# Patient Record
Sex: Female | Born: 1935 | Race: White | Hispanic: No | Smoking: Never smoker
Health system: Southern US, Community
[De-identification: ages and names within clinical notes are randomized; demographics above are authoritative.]

## PROBLEM LIST (undated history)

## (undated) DIAGNOSIS — H3530 Unspecified macular degeneration: Secondary | ICD-10-CM

## (undated) DIAGNOSIS — F0390 Unspecified dementia without behavioral disturbance: Secondary | ICD-10-CM

## (undated) DIAGNOSIS — H9190 Unspecified hearing loss, unspecified ear: Secondary | ICD-10-CM

## (undated) DIAGNOSIS — J45909 Unspecified asthma, uncomplicated: Secondary | ICD-10-CM

## (undated) DIAGNOSIS — C801 Malignant (primary) neoplasm, unspecified: Secondary | ICD-10-CM

## (undated) HISTORY — PX: MASTECTOMY: SHX3

---

## 2003-02-21 ENCOUNTER — Other Ambulatory Visit: Payer: Self-pay

## 2010-12-13 ENCOUNTER — Emergency Department: Payer: Self-pay | Admitting: Emergency Medicine

## 2010-12-14 ENCOUNTER — Emergency Department: Payer: Self-pay | Admitting: Emergency Medicine

## 2010-12-17 ENCOUNTER — Emergency Department: Payer: Self-pay | Admitting: Internal Medicine

## 2012-10-26 ENCOUNTER — Emergency Department: Payer: Self-pay | Admitting: Emergency Medicine

## 2012-10-26 LAB — CBC
HCT: 39.6 % — ABNORMAL LOW (ref 40.0–52.0)
HGB: 13.4 g/dL (ref 12.0–16.0)
MCH: 31 pg (ref 26.0–34.0)
MCHC: 33.8 g/dL (ref 32.0–36.0)
Platelet: 218 10*3/uL (ref 150–440)

## 2012-10-26 LAB — COMPREHENSIVE METABOLIC PANEL
Albumin: 4 g/dL (ref 3.4–5.0)
Alkaline Phosphatase: 194 U/L — ABNORMAL HIGH (ref 50–136)
Anion Gap: 7 (ref 7–16)
Calcium, Total: 9 mg/dL (ref 8.5–10.1)
Chloride: 102 mmol/L (ref 98–107)
Co2: 27 mmol/L (ref 21–32)
Creatinine: 0.85 mg/dL (ref 0.60–1.30)
EGFR (African American): >60
EGFR (Non-African Amer.): 55 — ABNORMAL LOW
Glucose: 137 mg/dL — ABNORMAL HIGH (ref 65–99)
Osmolality: 275 (ref 275–301)
Potassium: 3.9 mmol/L (ref 3.5–5.1)
SGOT(AST): 152 U/L — ABNORMAL HIGH (ref 15–37)
Sodium: 136 mmol/L (ref 136–145)

## 2012-10-26 LAB — URINALYSIS, COMPLETE
Bilirubin,UR: NEGATIVE
Ketone: NEGATIVE
Nitrite: NEGATIVE
Ph: 6 (ref 4.5–8.0)
RBC,UR: 3 /HPF (ref 0–5)
Specific Gravity: 1.013 (ref 1.003–1.030)

## 2014-08-30 ENCOUNTER — Encounter: Payer: Self-pay | Admitting: Emergency Medicine

## 2014-08-30 ENCOUNTER — Emergency Department: Payer: Commercial Managed Care - HMO

## 2014-08-30 ENCOUNTER — Other Ambulatory Visit: Payer: Self-pay

## 2014-08-30 ENCOUNTER — Observation Stay
Admission: EM | Admit: 2014-08-30 | Discharge: 2014-08-31 | Disposition: A | Discharge status: Home / Self Care | Payer: Commercial Managed Care - HMO | Attending: Internal Medicine | Admitting: Internal Medicine

## 2014-08-30 DIAGNOSIS — R748 Abnormal levels of other serum enzymes: Secondary | ICD-10-CM | POA: Insufficient documentation

## 2014-08-30 DIAGNOSIS — M79601 Pain in right arm: Secondary | ICD-10-CM | POA: Insufficient documentation

## 2014-08-30 DIAGNOSIS — M25519 Pain in unspecified shoulder: Secondary | ICD-10-CM | POA: Insufficient documentation

## 2014-08-30 DIAGNOSIS — Z853 Personal history of malignant neoplasm of breast: Secondary | ICD-10-CM | POA: Insufficient documentation

## 2014-08-30 DIAGNOSIS — R911 Solitary pulmonary nodule: Secondary | ICD-10-CM | POA: Diagnosis present

## 2014-08-30 DIAGNOSIS — I340 Nonrheumatic mitral (valve) insufficiency: Secondary | ICD-10-CM | POA: Diagnosis not present

## 2014-08-30 DIAGNOSIS — I517 Cardiomegaly: Secondary | ICD-10-CM | POA: Diagnosis not present

## 2014-08-30 DIAGNOSIS — R0789 Other chest pain: Principal | ICD-10-CM | POA: Insufficient documentation

## 2014-08-30 DIAGNOSIS — R413 Other amnesia: Secondary | ICD-10-CM | POA: Diagnosis not present

## 2014-08-30 DIAGNOSIS — I071 Rheumatic tricuspid insufficiency: Secondary | ICD-10-CM | POA: Diagnosis not present

## 2014-08-30 DIAGNOSIS — M4856XA Collapsed vertebra, not elsewhere classified, lumbar region, initial encounter for fracture: Secondary | ICD-10-CM | POA: Insufficient documentation

## 2014-08-30 DIAGNOSIS — R778 Other specified abnormalities of plasma proteins: Secondary | ICD-10-CM

## 2014-08-30 DIAGNOSIS — R0602 Shortness of breath: Secondary | ICD-10-CM | POA: Diagnosis not present

## 2014-08-30 DIAGNOSIS — I2510 Atherosclerotic heart disease of native coronary artery without angina pectoris: Secondary | ICD-10-CM | POA: Insufficient documentation

## 2014-08-30 DIAGNOSIS — R079 Chest pain, unspecified: Secondary | ICD-10-CM

## 2014-08-30 DIAGNOSIS — J9811 Atelectasis: Secondary | ICD-10-CM | POA: Insufficient documentation

## 2014-08-30 DIAGNOSIS — I10 Essential (primary) hypertension: Secondary | ICD-10-CM | POA: Diagnosis not present

## 2014-08-30 DIAGNOSIS — R7989 Other specified abnormal findings of blood chemistry: Secondary | ICD-10-CM

## 2014-08-30 HISTORY — DX: Malignant (primary) neoplasm, unspecified: C80.1

## 2014-08-30 LAB — CBC
HCT: 40.2 % (ref 35.0–47.0)
Hemoglobin: 13.4 g/dL (ref 12.0–16.0)
MCH: 29.8 pg (ref 26.0–34.0)
MCHC: 33.3 g/dL (ref 32.0–36.0)
MCV: 89.7 fL (ref 80.0–100.0)
PLATELETS: 178 10*3/uL (ref 150–440)
RBC: 4.48 MIL/uL (ref 3.80–5.20)
RDW: 14.8 % — AB (ref 11.5–14.5)
WBC: 12.5 10*3/uL — ABNORMAL HIGH (ref 3.6–11.0)

## 2014-08-30 LAB — URINALYSIS COMPLETE WITH MICROSCOPIC (ARMC ONLY)
BACTERIA UA: NONE SEEN
BILIRUBIN URINE: NEGATIVE
Glucose, UA: NEGATIVE mg/dL
LEUKOCYTES UA: NEGATIVE
NITRITE: NEGATIVE
PH: 5 (ref 5.0–8.0)
Protein, ur: NEGATIVE mg/dL
Specific Gravity, Urine: >1.06 — ABNORMAL HIGH (ref 1.005–1.030)

## 2014-08-30 LAB — TROPONIN I
Troponin I: 0.04 ng/mL — ABNORMAL HIGH (ref <0.031)
Troponin I: 0.05 ng/mL — ABNORMAL HIGH (ref <0.031)

## 2014-08-30 LAB — COMPREHENSIVE METABOLIC PANEL
ALBUMIN: 4.4 g/dL (ref 3.5–5.0)
ALT: 11 U/L — ABNORMAL LOW (ref 14–54)
ANION GAP: 10 (ref 5–15)
AST: 26 U/L (ref 15–41)
Alkaline Phosphatase: 63 U/L (ref 38–126)
BILIRUBIN TOTAL: 0.8 mg/dL (ref 0.3–1.2)
BUN: 26 mg/dL — ABNORMAL HIGH (ref 6–20)
CHLORIDE: 103 mmol/L (ref 101–111)
CO2: 26 mmol/L (ref 22–32)
Calcium: 9.4 mg/dL (ref 8.9–10.3)
Creatinine, Ser: 1.1 mg/dL — ABNORMAL HIGH (ref 0.44–1.00)
GFR calc Af Amer: 45 mL/min — ABNORMAL LOW (ref >60)
GFR calc non Af Amer: 39 mL/min — ABNORMAL LOW (ref >60)
GLUCOSE: 114 mg/dL — AB (ref 65–99)
POTASSIUM: 4.3 mmol/L (ref 3.5–5.1)
SODIUM: 139 mmol/L (ref 135–145)
TOTAL PROTEIN: 7.6 g/dL (ref 6.5–8.1)

## 2014-08-30 MED ORDER — ACETAMINOPHEN 325 MG PO TABS
650.0000 mg | ORAL_TABLET | Freq: Four times a day (QID) | ORAL | Status: DC | PRN
  Administered 2014-08-31: 650 mg via ORAL
  Filled 2014-08-30: qty 2

## 2014-08-30 MED ORDER — HEPARIN SODIUM (PORCINE) 5000 UNIT/ML IJ SOLN: 5000.0000 [IU] | Freq: Three times a day (TID) | INTRAMUSCULAR | Status: DC

## 2014-08-30 MED ORDER — ASPIRIN EC 81 MG PO TBEC
81.0000 mg | DELAYED_RELEASE_TABLET | Freq: Every day | ORAL | Status: DC
  Administered 2014-08-31: 81 mg via ORAL
  Filled 2014-08-30: qty 1

## 2014-08-30 MED ORDER — NITROGLYCERIN 0.4 MG SL SUBL: 0.4000 mg | SUBLINGUAL_TABLET | SUBLINGUAL | Status: DC | PRN

## 2014-08-30 MED ORDER — ONDANSETRON HCL 4 MG/2ML IJ SOLN: 4.0000 mg | Freq: Four times a day (QID) | INTRAMUSCULAR | Status: DC | PRN

## 2014-08-30 MED ORDER — MORPHINE SULFATE (PF) 2 MG/ML IV SOLN: 1.0000 mg | INTRAVENOUS | Status: DC | PRN

## 2014-08-30 MED ORDER — SODIUM CHLORIDE 0.9 % IV SOLN
INTRAVENOUS | Status: DC
  Administered 2014-08-30: 18:00:00 via INTRAVENOUS

## 2014-08-30 MED ORDER — ASPIRIN 81 MG PO CHEW
162.0000 mg | CHEWABLE_TABLET | Freq: Once | ORAL | Status: AC
  Administered 2014-08-30: 162 mg via ORAL
  Filled 2014-08-30: qty 2

## 2014-08-30 MED ORDER — DOCUSATE SODIUM 100 MG PO CAPS
100.0000 mg | ORAL_CAPSULE | Freq: Two times a day (BID) | ORAL | Status: DC
  Administered 2014-08-31: 100 mg via ORAL
  Filled 2014-08-30: qty 1

## 2014-08-30 MED ORDER — ACETAMINOPHEN 650 MG RE SUPP: 650.0000 mg | Freq: Four times a day (QID) | RECTAL | Status: DC | PRN

## 2014-08-30 MED ORDER — SODIUM CHLORIDE 0.9 % IJ SOLN: 3.0000 mL | Freq: Two times a day (BID) | INTRAMUSCULAR | Status: DC

## 2014-08-30 MED ORDER — IOHEXOL 350 MG/ML SOLN
75.0000 mL | Freq: Once | INTRAVENOUS | Status: AC | PRN
  Administered 2014-08-30: 75 mL via INTRAVENOUS

## 2014-08-30 MED ORDER — METOPROLOL SUCCINATE ER 25 MG PO TB24
25.0000 mg | ORAL_TABLET | Freq: Every day | ORAL | Status: DC
  Administered 2014-08-30 – 2014-08-31 (×2): 25 mg via ORAL
  Filled 2014-08-30 (×2): qty 1

## 2014-08-30 MED ORDER — ONDANSETRON HCL 4 MG PO TABS: 4.0000 mg | ORAL_TABLET | Freq: Four times a day (QID) | ORAL | Status: DC | PRN

## 2014-08-30 MED ORDER — BISACODYL 10 MG RE SUPP: 10.0000 mg | Freq: Every day | RECTAL | Status: DC | PRN

## 2014-08-30 NOTE — ED Provider Notes (Signed)
[facility] Emergency Department Provider Note  ____________________________________________  Time seen: Approximately 3:11 PM  I have reviewed the triage vital signs and the nursing notes.   HISTORY  Chief Complaint Chest Pain    HPI Brandi Nelson is a 81 y.o. female who reports no medical history. She presents today with right-sided chest pain that began 1-2 days ago, and has worsened throughout the day. She had to take 1 ibuprofen tablet today, which is highly unusual for her and still having sharp right-sided chest pain. Symptoms started in her right arm and now has moved into her right chest. She also feels slightly short of breath at times.  No previous history coronary disease. No recent surgeries. No leg swelling. Timing was rather sudden onset about 2 days ago, possibly associated with working in her yard though her memory is not perfect per her son.   Past Medical History  Diagnosis Date  . Cancer     There are no active problems to display for this patient.   History reviewed. No pertinent past surgical history.  No current outpatient prescriptions on file.  Allergies Review of patient's allergies indicates no known allergies.  No family history on file.  Social History Social History  Substance Use Topics  . Smoking status: Never Smoker   . Smokeless tobacco: None  . Alcohol Use: No   Does not smoke not drink Does not take any medicine No known drug allergies  Review of Systems Constitutional: No fever/chills Eyes: No visual changes. ENT: No sore throat. Cardiovascular: See history of present illness Respiratory: See history of present illness Gastrointestinal: No abdominal pain.  No nausea, no vomiting.  No diarrhea.  No constipation. Genitourinary: Negative for dysuria. Musculoskeletal: Negative for back pain. Skin: Negative for rash. Neurological: Negative for headaches, focal weakness or numbness.  10-point ROS  otherwise negative.  ____________________________________________   PHYSICAL EXAM:  VITAL SIGNS: ED Triage Vitals  Enc Vitals Group     BP 08/30/14 1335 142/57 mmHg     Pulse Rate 08/30/14 1335 97     Resp 08/30/14 1335 18     Temp 08/30/14 1335 99.2 F (37.3 C)     Temp Source 08/30/14 1335 Oral     SpO2 08/30/14 1335 96 %     Weight 08/30/14 1335 132 lb (59.875 kg)     Height 08/30/14 1335 4\' 11"  (1.499 m)     Head Cir --      Peak Flow --      Pain Score --      Pain Loc --      Pain Edu? --      Excl. in [place]? --     Constitutional: Alert and oriented to person and location. Well appearing and in no acute distress. Eyes: Conjunctivae are normal. PERRL. EOMI. Head: Atraumatic. Nose: No congestion/rhinnorhea. Mouth/Throat: Mucous membranes are moist.  Oropharynx non-erythematous. Neck: No stridor.   Cardiovascular: Irregular rate and rhythm. Pulse does correspond with ECG tracing. Grossly normal heart sounds.  Good peripheral circulation. Respiratory: Normal respiratory effort.  No retractions. Lungs CTAB. Gastrointestinal: Soft and nontender. No distention. No abdominal bruits. No CVA tenderness. Musculoskeletal: No lower extremity tenderness nor edema.  No joint effusions. Neurologic:  Normal speech and language. No gross focal neurologic deficits are appreciated. No gait instability. Skin:  Skin is warm, dry and intact. No rash noted. Psychiatric: Mood and affect are normal. Speech and behavior are normal.  Patient has a astonishingly normal exam  for a 81 year old female ____________________________________________   LABS (all labs ordered are listed, but only abnormal results are displayed)  Labs Reviewed  CBC - Abnormal; Notable for the following:    WBC 12.5 (*)    RDW 14.8 (*)    All other components within normal limits  COMPREHENSIVE METABOLIC PANEL - Abnormal; Notable for the following:    Glucose, Bld 114 (*)    BUN 26 (*)    Creatinine, Ser 1.10 (*)     ALT 11 (*)    GFR calc non Af Amer 39 (*)    GFR calc Af Amer 45 (*)    All other components within normal limits  TROPONIN I - Abnormal; Notable for the following:    Troponin I 0.04 (*)    All other components within normal limits   ____________________________________________  EKG  Reviewed and interpreted by me EKG time 3:10 PM Bifascicular block [place] No acute ischemic change noted in the setting of bifascicular block, with expected T wave inversions in anteroseptal leads Ventricular rate 90 PR 170 QRS 1:30 QTc 480 Reviewed and interpreted as bifascicular block, no previous for comparison, no evidence of ST elevation ____________________________________________  RADIOLOGY  CT ANGIO CHEST AORTA W/CM &/OR WO/CM (Final result) Result time: 08/30/14 16:34:14   Final result by Rad Results In Interface (08/30/14 16:34:14)   Narrative:   CLINICAL DATA: Chest and right arm pain for 2 days with shortness of breath. Initial encounter.  EXAM: CT ANGIOGRAPHY CHEST WITH CONTRAST  TECHNIQUE: Multidetector CT imaging of the chest was performed using the standard protocol during bolus administration of intravenous contrast. Multiplanar CT image reconstructions and MIPs were obtained to evaluate the vascular anatomy.  CONTRAST: 75 mL OMNIPAQUE IOHEXOL 350 MG/ML SOLN  COMPARISON: Single view of the chest earlier today.  FINDINGS: There is no aortic dissection or aneurysm. Calcific aortic and coronary atherosclerosis is noted. The patient has a normal three-vessel arch configuration. Although not designed to evaluate for pulmonary embolus. No central or proximal embolus is seen. There is cardiomegaly. No pleural or pericardial effusion. No axillary, hilar or mediastinal lymphadenopathy. A 0.2 cm right upper lobe nodule is noted on image 21. There is some dependent atelectasis.  Imaged upper abdomen is unremarkable. No lytic or sclerotic bony lesion is identified.  Remote T11, T12 and L1 compression fractures are seen.  Review of the MIP images confirms the above findings.  IMPRESSION: No acute abnormality. Negative for aortic dissection or aneurysm.  Calcific aortic and coronary atherosclerosis.  Cardiomegaly without edema.  0.2 cm right upper lobe pulmonary nodule. If the patient is at high risk for bronchogenic carcinoma, follow-up chest CT at 1 year is recommended. If the patient is at low risk, no follow-up is needed. This recommendation follows the consensus statement: Guidelines for Management of Small Pulmonary Nodules Detected on CT Scans: A Statement from the [place] as published in Radiology 2005; 237:395-400.  Remote T11, T12 and L1 compression fractures.   Electronically Signed By: Inge Rise M.D. On: 08/30/2014 16:34          DG Chest Port 1 View (Final result) Result time: 08/30/14 16:08:06   Final result by Rad Results In Interface (08/30/14 16:08:06)   Narrative:   CLINICAL DATA: pain in her chest and having some right arm pain, family states pt also has been c/o of some sob.  EXAM: PORTABLE CHEST - 1 VIEW  COMPARISON: None.  FINDINGS: Cardiac silhouette is mildly enlarged. The aorta is uncoiled. No mediastinal  or hilar masses or evidence of adenopathy.  Clear lungs. No pleural effusion or pneumothorax.  There old bilateral proximal humerus fractures. Bony thorax is diffusely demineralized.  IMPRESSION: No acute cardiopulmonary disease.     ____________________________________________   PROCEDURES  Procedure(s) performed: None  Critical Care performed: No  ____________________________________________   INITIAL IMPRESSION / ASSESSMENT AND PLAN / ED COURSE  Pertinent labs & imaging results that were available during my care of the patient were reviewed by me and considered in my medical decision making (see chart for details).  Patient presents with chest pain.  Right-sided radiating to the arm and chest, some movement into the chest over the last day. Patient does not have a known coronary disease, but is 89 this is hard to exclude.  Differential diagnosis certainly included acute coronary syndrome, pulmonary embolism, acute aortic dissection. Primary concern given the movement of her pain from the right arm and of the chest would be to rule out dissection. CT angiogram is negative for dissection, positive for pulmonary nodule.  First troponin is slightly +0.04. We will admit her to the hospital for ongoing management and further workup for chest pain.  FINAL CLINICAL IMPRESSION(S) / ED DIAGNOSES  Final diagnoses:  Chest pain, moderate coronary artery risk  Pulmonary nodule      Delman Kitten, MD 08/30/14 1640

## 2014-08-30 NOTE — ED Notes (Signed)
Pt to ED from [facility], family states pt got out and raked her leaves 2 days ago, states since yesterday pt has been c/o pain in her chest and having some right arm pain, family states pt also has been c/o of some sob

## 2014-08-30 NOTE — Progress Notes (Signed)
Patient admitted to unit. Oriented to room, call bell, and staff. Bed in lowest position. Fall safety plan reviewed. Full assessment to Epic. Skin assessment verified with Maddie Himes RN. Telemetry box verification with tele clerk- Box#:[id number]. Will continue to monitor.

## 2014-08-30 NOTE — ED Notes (Signed)
Gave verbal lab results (troponin 0.04) to [place], Therapist, sports.

## 2014-08-30 NOTE — Progress Notes (Signed)
Dr. Tressia Miners notified of patients high BP. Patient was just moved up here, has lots of family in the room and was just stuck by lab tech. Per MD, give her time to settle down and then recheck BP

## 2014-08-30 NOTE — H&P (Signed)
History and Physical    Brandi Nelson QTM:[id number] DOB: 30-May-1907 DOA: 08/30/2014  Referring physician: Dr. Jacqualine Code PCP: Madelyn Brunner, MD  Specialists: none  Chief Complaint: chest pain  HPI: Brandi Nelson is a 81 y.o. female has a past medical history significant for "cancer" now with sharp shooting substernal and right-sided chest pain radiating to right jaw and neck. Pain comes and goes. No cardiac hx. Currently pain-free. Troponin abnormal at 0.04. She is now admitted for further evaluation.  Review of Systems: The patient denies anorexia, fever, weight loss,, vision loss, decreased hearing, hoarseness, syncope, dyspnea on exertion, peripheral edema, balance deficits, hemoptysis, abdominal pain, melena, hematochezia, severe indigestion/heartburn, hematuria, incontinence, genital sores, muscle weakness, suspicious skin lesions, transient blindness, difficulty walking, depression, unusual weight change, abnormal bleeding, enlarged lymph nodes, angioedema, and breast masses.   Past Medical History  Diagnosis Date  . Cancer    History reviewed. No pertinent past surgical history. Social History:  reports that she has never smoked. She does not have any smokeless tobacco history on file. She reports that she does not drink alcohol. Her drug history is not on file.  No Known Allergies  History reviewed. No pertinent family history.  Prior to Admission medications   Not on File   Physical Exam: Filed Vitals:   08/30/14 1335  BP: 142/57  Pulse: 97  Temp: 99.2 F (37.3 C)  TempSrc: Oral  Resp: 18  Height: 4\' 11"  (1.499 m)  Weight: 59.875 kg (132 lb)  SpO2: 96%     General:  No apparent distress  Eyes: PERRL, EOMI, no scleral icterus  ENT: moist oropharynx  Neck: supple, no lymphadenopathy  Cardiovascular: regular rate without MRG; 2+ peripheral pulses, no JVD, no peripheral edema  Respiratory: CTA biL, good air movement without wheezing, rhonchi or  crackled  Abdomen: soft, non tender to palpation, positive bowel sounds, no guarding, no rebound  Skin: no rashes  Musculoskeletal: normal bulk and tone, no joint swelling  Psychiatric: normal mood and affect  Neurologic: CN 2-12 grossly intact, MS 5/5 in all 4  Labs on Admission:  Basic Metabolic Panel:  Recent Labs Lab 08/30/14 1340  NA 139  K 4.3  CL 103  CO2 26  GLUCOSE 114*  BUN 26*  CREATININE 1.10*  CALCIUM 9.4   Liver Function Tests:  Recent Labs Lab 08/30/14 1340  AST 26  ALT 11*  ALKPHOS 63  BILITOT 0.8  PROT 7.6  ALBUMIN 4.4   No results for input(s): LIPASE, AMYLASE in the last 168 hours. No results for input(s): AMMONIA in the last 168 hours. CBC:  Recent Labs Lab 08/30/14 1340  WBC 12.5*  HGB 13.4  HCT 40.2  MCV 89.7  PLT 178   Cardiac Enzymes:  Recent Labs Lab 08/30/14 1340  TROPONINI 0.04*    BNP (last 3 results) No results for input(s): BNP in the last 8760 hours.  ProBNP (last 3 results) No results for input(s): PROBNP in the last 8760 hours.  CBG: No results for input(s): GLUCAP in the last 168 hours.  Radiological Exams on Admission: Dg Chest Port 1 View  08/30/2014   CLINICAL DATA:  pain in her chest and having some right arm pain, family states pt also has been c/o of some sob.  EXAM: PORTABLE CHEST - 1 VIEW  COMPARISON:  None.  FINDINGS: Cardiac silhouette is mildly enlarged. The aorta is uncoiled. No mediastinal or hilar masses or evidence of adenopathy.  Clear lungs.  No pleural effusion or pneumothorax.  There old bilateral proximal humerus fractures. Bony thorax is diffusely demineralized.  IMPRESSION: No acute cardiopulmonary disease.   Electronically Signed   By: Lajean Manes M.D.   On: 08/30/2014 16:08   Ct Angio Chest Aorta W/cm &/or Wo/cm  08/30/2014   CLINICAL DATA:  Chest and right arm pain for 2 days with shortness of breath. Initial encounter.  EXAM: CT ANGIOGRAPHY CHEST WITH CONTRAST  TECHNIQUE:  Multidetector CT imaging of the chest was performed using the standard protocol during bolus administration of intravenous contrast. Multiplanar CT image reconstructions and MIPs were obtained to evaluate the vascular anatomy.  CONTRAST:  75 mL OMNIPAQUE IOHEXOL 350 MG/ML SOLN  COMPARISON:  Single view of the chest earlier today.  FINDINGS: There is no aortic dissection or aneurysm. Calcific aortic and coronary atherosclerosis is noted. The patient has a normal three-vessel arch configuration. Although not designed to evaluate for pulmonary embolus. No central or proximal embolus is seen. There is cardiomegaly. No pleural or pericardial effusion. No axillary, hilar or mediastinal lymphadenopathy. A 0.2 cm right upper lobe nodule is noted on image 21. There is some dependent atelectasis.  Imaged upper abdomen is unremarkable. No lytic or sclerotic bony lesion is identified. Remote T11, T12 and L1 compression fractures are seen.  Review of the MIP images confirms the above findings.  IMPRESSION: No acute abnormality.  Negative for aortic dissection or aneurysm.  Calcific aortic and coronary atherosclerosis.  Cardiomegaly without edema.  0.2 cm right upper lobe pulmonary nodule. If the patient is at high risk for bronchogenic carcinoma, follow-up chest CT at 1 year is recommended. If the patient is at low risk, no follow-up is needed. This recommendation follows the consensus statement: Guidelines for Management of Small Pulmonary Nodules Detected on CT Scans: A Statement from the [place] as published in Radiology 2005; 237:395-400.  Remote T11, T12 and L1 compression fractures.   Electronically Signed   By: Inge Rise M.D.   On: 08/30/2014 16:34    EKG: Independently reviewed.  Assessment/Plan Principal Problem:   Chest pain Active Problems:   Elevated troponin   Will observe on telemetry and follow enzymes. Order echo and Cardiology consult. Monitor BP closely. CM consult for D/C  planning.  Diet: heart healthy Fluids: NS@75  DVT Prophylaxis: SQ Heparin  Code Status: DNR  Family Communication: yes  Disposition Plan: home  Time spent: 50 min

## 2014-08-31 ENCOUNTER — Observation Stay
Admit: 2014-08-31 | Discharge: 2014-08-31 | Disposition: A | Discharge status: Home / Self Care | Payer: Commercial Managed Care - HMO | Attending: Internal Medicine | Admitting: Internal Medicine

## 2014-08-31 LAB — TROPONIN I: Troponin I: 0.04 ng/mL — ABNORMAL HIGH (ref <0.031)

## 2014-08-31 MED ORDER — METOPROLOL SUCCINATE ER 25 MG PO TB24: 12.5000 mg | ORAL_TABLET | Freq: Every day | ORAL | Status: DC

## 2014-08-31 MED ORDER — NITROGLYCERIN 0.4 MG SL SUBL: 0.4000 mg | SUBLINGUAL_TABLET | SUBLINGUAL | Status: DC | PRN

## 2014-08-31 MED ORDER — ASPIRIN 81 MG PO TBEC: 81.0000 mg | DELAYED_RELEASE_TABLET | Freq: Every day | ORAL | Status: DC

## 2014-08-31 NOTE — Discharge Summary (Signed)
[facility] [place] NAME: Brandi Nelson    MR#:  [id number]  DATE OF BIRTH:  May 18, 1907  DATE OF ADMISSION:  08/30/2014 ADMITTING PHYSICIAN: Idelle Crouch, MD  DATE OF DISCHARGE: 08/31/14  PRIMARY CARE PHYSICIAN: Madelyn Brunner, MD    ADMISSION DIAGNOSIS:  Pulmonary nodule [R91.1] Chest pain, moderate coronary artery risk [R07.9]  DISCHARGE DIAGNOSIS:  Chest pain with shoulder pain supect muscular strain from recent yard work Mild HTN Memory loss  SECONDARY DIAGNOSIS:   Past Medical History  Diagnosis Date  . Cancer     breast    HOSPITAL COURSE:   81 year old Brandi Nelson with past medical history of breast cancer status post mastectomy comes into the emergency room after she started having some chest discomfort shoulder pain and back pain after raking leaves in her yard. Her family she is quite she is active and likes to do her work around at home. She was admitted with  1. Chest pain with minimal elevated troponin EKG shows bifascicular block no old EKG for comparison. On aspirin and metoprolol Seen by Dr.fath recommended to continue medical management Echo pending Hematologically stable PT to evaluate prior to discharge  2. Mild elevated blood pressure Low-dose metoprolol  3. memory imprairment -recommend to get PCP evaluate for dementia as out pt.  Spoke with patient's daughter-in-law and son over the phone. Patient will be discharged later if she continues to remain stable.  DISCHARGE CONDITIONS:   fair CONSULTS OBTAINED:  Treatment Team:  Teodoro Spray, MD  DRUG ALLERGIES:  No Known Allergies  DISCHARGE MEDICATIONS:   Current Discharge Medication List    START taking these medications   Details  aspirin EC 81 MG EC tablet Take 1 tablet (81 mg total) by mouth daily. Qty: 30 tablet, Refills: 0    metoprolol succinate (TOPROL-XL) 25 MG 24 hr tablet Take 0.5 tablets (12.5 mg total) by  mouth daily. Qty: 30 tablet, Refills: 0    nitroGLYCERIN (NITROSTAT) 0.4 MG SL tablet Place 1 tablet (0.4 mg total) under the tongue every 5 (five) minutes as needed for chest pain. Qty: 20 tablet, Refills: 12        If you experience worsening of your admission symptoms, develop shortness of breath, life threatening emergency, suicidal or homicidal thoughts you must seek medical attention immediately by calling 911 or calling your MD immediately  if symptoms less severe.  You Must read complete instructions/literature along with all the possible adverse reactions/side effects for all the Medicines you take and that have been prescribed to you. Take any new Medicines after you have completely understood and accept all the possible adverse reactions/side effects.   Please note  You were cared for by a hospitalist during your hospital stay. If you have any questions about your discharge medications or the care you received while you were in the hospital after you are discharged, you can call the unit and asked to speak with the hospitalist on call if the hospitalist that took care of you is not available. Once you are discharged, your primary care physician will handle any further medical issues. Please note that NO REFILLS for any discharge medications will be authorized once you are discharged, as it is imperative that you return to your primary care physician (or establish a relationship with a primary care physician if you do not have one) for your aftercare needs so that they can reassess your need for  medications and monitor your lab values. Today   SUBJECTIVE  Doing well. No cp. Eating lunch. Hard on hearing. Pleasant confusion   VITAL SIGNS:  Blood pressure 167/69, pulse 85, temperature 97.7 F (36.5 C), temperature source Oral, resp. rate 18, height 4\' 11"  (1.499 m), weight 62.46 kg (137 lb 11.2 oz), SpO2 97 %.  I/O:   Intake/Output Summary (Last 24 hours) at 08/31/14 1326 Last  data filed at 08/31/14 1024  Gross per 24 hour  Intake   1065 ml  Output    445 ml  Net    620 ml    PHYSICAL EXAMINATION:  GENERAL:  81 y.o.-year-old patient lying in the bed with no acute distress.  EYES: Pupils equal, round, reactive to light and accommodation. No scleral icterus. Extraocular muscles intact.  HEENT: Head atraumatic, normocephalic. Oropharynx and nasopharynx clear.  NECK:  Supple, no jugular venous distention. No thyroid enlargement, no tenderness.  LUNGS: Normal breath sounds bilaterally, no wheezing, rales,rhonchi or crepitation. No use of accessory muscles of respiration. kyphosis CARDIOVASCULAR: S1, S2 normal. No murmurs, rubs, or gallops.  ABDOMEN: Soft, non-tender, non-distended. Bowel sounds present. No organomegaly or mass.  EXTREMITIES: No pedal edema, cyanosis, or clubbing.  NEUROLOGIC: Cranial nerves II through XII are intact. Muscle strength 5/5 in all extremities. Sensation intact. Gait not checked.  PSYCHIATRIC: The patient is alert and pleasantly confused  SKIN: No obvious rash, lesion, or ulcer.   DATA REVIEW:   CBC   Recent Labs Lab 08/30/14 1340  WBC 12.5*  HGB 13.4  HCT 40.2  PLT 178    Chemistries   Recent Labs Lab 08/30/14 1340  NA 139  K 4.3  CL 103  CO2 26  GLUCOSE 114*  BUN 26*  CREATININE 1.10*  CALCIUM 9.4  AST 26  ALT 11*  ALKPHOS 34  BILITOT 0.8    Microbiology Results   No results found for this or any previous visit (from the past 240 hour(s)).  RADIOLOGY:  Dg Chest Port 1 View  08/30/2014   CLINICAL DATA:  pain in her chest and having some right arm pain, family states pt also has been c/o of some sob.  EXAM: PORTABLE CHEST - 1 VIEW  COMPARISON:  None.  FINDINGS: Cardiac silhouette is mildly enlarged. The aorta is uncoiled. No mediastinal or hilar masses or evidence of adenopathy.  Clear lungs.  No pleural effusion or pneumothorax.  There old bilateral proximal humerus fractures. Bony thorax is diffusely  demineralized.  IMPRESSION: No acute cardiopulmonary disease.   Electronically Signed   By: Lajean Manes M.D.   On: 08/30/2014 16:08   Ct Angio Chest Aorta W/cm &/or Wo/cm  08/30/2014   CLINICAL DATA:  Chest and right arm pain for 2 days with shortness of breath. Initial encounter.  EXAM: CT ANGIOGRAPHY CHEST WITH CONTRAST  TECHNIQUE: Multidetector CT imaging of the chest was performed using the standard protocol during bolus administration of intravenous contrast. Multiplanar CT image reconstructions and MIPs were obtained to evaluate the vascular anatomy.  CONTRAST:  75 mL OMNIPAQUE IOHEXOL 350 MG/ML SOLN  COMPARISON:  Single view of the chest earlier today.  FINDINGS: There is no aortic dissection or aneurysm. Calcific aortic and coronary atherosclerosis is noted. The patient has a normal three-vessel arch configuration. Although not designed to evaluate for pulmonary embolus. No central or proximal embolus is seen. There is cardiomegaly. No pleural or pericardial effusion. No axillary, hilar or mediastinal lymphadenopathy. A 0.2 cm right upper lobe nodule is noted  on image 21. There is some dependent atelectasis.  Imaged upper abdomen is unremarkable. No lytic or sclerotic bony lesion is identified. Remote T11, T12 and L1 compression fractures are seen.  Review of the MIP images confirms the above findings.  IMPRESSION: No acute abnormality.  Negative for aortic dissection or aneurysm.  Calcific aortic and coronary atherosclerosis.  Cardiomegaly without edema.  0.2 cm right upper lobe pulmonary nodule. If the patient is at high risk for bronchogenic carcinoma, follow-up chest CT at 1 year is recommended. If the patient is at low risk, no follow-up is needed. This recommendation follows the consensus statement: Guidelines for Management of Small Pulmonary Nodules Detected on CT Scans: A Statement from the [place] as published in Radiology 2005; 237:395-400.  Remote T11, T12 and L1 compression  fractures.   Electronically Signed   By: Inge Rise M.D.   On: 08/30/2014 16:34     Management plans discussed with the patient, family and they are in agreement.  CODE STATUS:     Code Status Orders        Start     Ordered   08/30/14 1815  Do not attempt resuscitation (DNR)   Continuous    Question Answer Comment  In the event of cardiac or respiratory ARREST Do not call a "code blue"   In the event of cardiac or respiratory ARREST Do not perform Intubation, CPR, defibrillation or ACLS   In the event of cardiac or respiratory ARREST Use medication by any route, position, wound care, and other measures to relive pain and suffering. May use oxygen, suction and manual treatment of airway obstruction as needed for comfort.      08/30/14 1814      TOTAL TIME TAKING CARE OF THIS PATIENT: 40 minutes.    Konstantina Nachreiner M.D on 08/31/2014 at 1:26 PM  Between 7am to 6pm - Pager - [phone] After 6pm go to [email or web address] - password [email or web address] [facility]  Office  [phone]  CC: Primary care physician; Madelyn Brunner, MD

## 2014-08-31 NOTE — Care Management (Signed)
Provided patient's son with list of assisted living facilities and sitter list.  He acknowledges that he needs to plan for increased supervision for his mother

## 2014-08-31 NOTE — Care Management (Signed)
Patient presents from home.  Lives alone.  Her son [name] goes to patient's home and checks her daily and takes her out to lunch.  Patient is very hard of hearing even with her hearing aide.  Son has not taken patient to her PCP for the last year because she has [employer] and "my wife and I had that too and [facility] called me and told me if i wanted to be seen at [facility] I had to change my insurance so I did.  [facility] "is [facility]."  Spoke with [facility] and patient is in her caseload.  Contacted [facility] and informed that office accepts [facility].  Follow up appointment has been made with Lottie Mussel. Son feels the most urgent need is getting patient's hearing checked.  Discussed making appointment with [facility].  Son feels that  It seems "like out of the blue" that he has started having to repeat things to patient and or patient forgets previous conversations.  Recognizes patient has issues with hearing but he feels this is more of a memory problem.  Patient has refused to move in with her son.  Son verbalizes understanding of the need for patient to have closer supervision.  Says that family has discussed assisted living. Agreeable to home health nursing for assessment/observation, physical therapy for home safety evaluation, aide if need for assist with personal care and social work for long range planning.  No agency preference.  [facility].  Attending will enter the home health order and face to face within the hour.  Referral to [facility]. Informed to call patient's son roger to schedule home visit- [phone]. Home [place]  Provided patient's son with Med Alert brochure.

## 2014-08-31 NOTE — Care Management (Signed)
Patient previously reported that se had a walker at hoe.  Son states this is not the case.  Obtained order from attending.  Agency preference is "whoever can get it here the quickest".  Referral called to [facility]

## 2014-08-31 NOTE — Discharge Instructions (Signed)
HHPT/RN  HOME HEALTH NURSE, PT, AIDE (IF NEEDED) SOCIAL WORK.  [facility]    1 [phone]

## 2014-08-31 NOTE — Progress Notes (Addendum)
Discharge instructions given to patient and her son. Education on metoprolol, nitroglycerin, and chest pain given. No IV to remove since patient inadvertently removed it last night. Tele removed and given to clerk by RN. Patient will follow up with PCP outpatient. Home health has been set up by care management and phone number given. No further questions. Patient is in no distress. Prescriptions electronically submitted.

## 2014-08-31 NOTE — Evaluation (Signed)
Physical Therapy Evaluation Patient Details Name: Brandi Nelson MRN: [id number] DOB: 1907-01-23 Today's Date: 08/31/2014   History of Present Illness  presented to ER and admitted under observation secondary to substernal chest pain radiating to R jaw, mild elevation in troponin (peak at 0.05; okay for participation with session per Dr. Fritzi Mandes).  Clinical Impression  Upon evaluation, patient alert and oriented to self only; follows all simple commands, but demonstrates marked deficit in STM requiring reorientation to situation multiple times throughout session.  Bilat UE/LE strength and ROM grossly WFL.  Currently requiring cga/min assist for all mobility without assist device; higher-level balance deficits noted as indicated by inability to maintain balance with dynamic gait components.  Mobility improved to cga/close sup with use of RW (associated with increase in gait speed as well).  Recommend continued use of RW with all mobility at this time. Patient/son voiced understanding. Would benefit from skilled PT to address above deficits and promote optimal return to PLOF.   Given cognitive impairment (exacerbated by hospital stay), recommend strict 24 hour sup/assist upon discharge.  Recommend transition to [place] upon discharge from acute hospitalization (question ability to tolerate, participate and progress with formal STR program).    Follow Up Recommendations Home health PT;Supervision/Assistance - 24 hour (strict 24 hour sup/assist; all home services available)    Equipment Recommendations  Rolling walker with 5" wheels    Recommendations for Other Services       Precautions / Restrictions Precautions Precautions: Fall Restrictions Weight Bearing Restrictions: No      Mobility  Bed Mobility               General bed mobility comments: up in chair beginning/end of session  Transfers Overall transfer level: Needs assistance   Transfers: Sit to/from Stand Sit to  Stand: Min assist         General transfer comment: requires UE support to complete  Ambulation/Gait Ambulation/Gait assistance: Min assist Ambulation Distance (Feet): 220 Feet Assistive device: None     Gait velocity interpretation: <1.8 ft/sec, indicative of risk for recurrent falls (10' walk time, 8 seconds) General Gait Details: broad BOS with excessive lateral sway; veers laterally at itmes with dynamic gait components (step strategy with cga/min assist for recovery)  Stairs            Wheelchair Mobility    Modified Rankin (Stroke Patients Only)       Balance Overall balance assessment: Needs assistance Sitting-balance support: No upper extremity supported;Feet supported Sitting balance-Leahy Scale: Good     Standing balance support: No upper extremity supported Standing balance-Leahy Scale: Fair                               Pertinent Vitals/Pain Pain Assessment: No/denies pain    Home Living Family/patient expects to be discharged to:: Private residence Living Arrangements: Alone Available Help at Discharge: Family (sons supportive and able to provide assist upon discharge) Type of Home: House Home Access: Stairs to enter Entrance Stairs-Rails: Can reach both Entrance Stairs-Number of Steps: 3 Home Layout: Two level;Able to live on main level with bedroom/bathroom        Prior Function Level of Independence: Independent         Comments: Indep with household and limited community mobility     Hand Dominance        Extremity/Trunk Assessment   Upper Extremity Assessment: Overall WFL for tasks assessed  Lower Extremity Assessment: Overall WFL for tasks assessed         Communication   Communication: No difficulties  Cognition Arousal/Alertness: Awake/alert Behavior During Therapy: WFL for tasks assessed/performed;Impulsive Overall Cognitive Status: Impaired/Different from baseline Area of Impairment:  Orientation;Memory;Safety/judgement;Awareness;Problem solving Orientation Level: Disoriented to;Place;Time;Situation   Memory: Decreased short-term memory (reoriented to situation multiple times throughout session) Following Commands: Follows one step commands consistently     Problem Solving: Difficulty sequencing      General Comments      Exercises Other Exercises Other Exercises: Gait x220' with RW, cga/close sup--reciprocal stepping with improved gait stability, fluidity and overall safety (gait speed improved to 10' walk time in 6 seconds with RW), though tends to step away from when approaching seating surfaces.  Likely to forget need/use if assist device is out of sight.  Do recommend use with all mobility at this time; patient/son voiced understanding (10 min)      Assessment/Plan    PT Assessment Patient needs continued PT services  PT Diagnosis Difficulty walking;Generalized weakness   PT Problem List Decreased strength;Decreased range of motion;Decreased activity tolerance;Decreased balance;Decreased mobility;Decreased coordination;Decreased cognition;Decreased knowledge of use of DME;Decreased safety awareness;Decreased knowledge of precautions  PT Treatment Interventions DME instruction;Gait training;Stair training;Functional mobility training;Therapeutic activities;Therapeutic exercise;Balance training;Neuromuscular re-education;Cognitive remediation;Patient/family education   PT Goals (Current goals can be found in the Care Plan section) Acute Rehab PT Goals Patient Stated Goal: "to go home" PT Goal Formulation: With patient Time For Goal Achievement: 09/14/14 Potential to Achieve Goals: Fair    Frequency Min 2X/week   Barriers to discharge Decreased caregiver support      Co-evaluation               End of Session Equipment Utilized During Treatment: Gait belt Activity Tolerance: Patient tolerated treatment well Patient left: in chair;with  family/visitor present Nurse Communication: Mobility status    Functional Assessment Tool Used: clinical judgement; 10' walk time Functional Limitation: Mobility: Walking and moving around Mobility: Walking and Moving Around Current Status [phone]): At least 20 percent but less than 40 percent impaired, limited or restricted Mobility: Walking and Moving Around Goal Status [phone]): At least 1 percent but less than 20 percent impaired, limited or restricted    Time: 1411-1433 PT Time Calculation (min) (ACUTE ONLY): 22 min   Charges:   PT Evaluation $Initial PT Evaluation Tier I: 1 Procedure PT Treatments $Gait Training: 8-22 mins   PT G Codes:   PT G-Codes **NOT FOR INPATIENT CLASS** Functional Assessment Tool Used: clinical judgement; 10' walk time Functional Limitation: Mobility: Walking and moving around Mobility: Walking and Moving Around Current Status ([id number]): At least 20 percent but less than 40 percent impaired, limited or restricted Mobility: Walking and Moving Around Goal Status [phone]): At least 1 percent but less than 20 percent impaired, limited or restricted    Taleya Whitcher H. Lupa Shark, PT, DPT, NCS 08/31/2014, 5:08 PM [phone]

## 2014-08-31 NOTE — Progress Notes (Signed)
Brandi Nelson has arrived. Patient will now discharge via son. Taken out in wheelchair by staff.

## 2014-08-31 NOTE — Progress Notes (Signed)
*  PRELIMINARY RESULTS* Echocardiogram 2D Echocardiogram has been performed.  Brandi Nelson 08/31/2014, 2:44 PM

## 2014-08-31 NOTE — Consult Note (Signed)
[facility]  CARDIOLOGY CONSULT NOTE  Patient ID: SELETA HOVLAND MRN: [id number] DOB/AGE: 02/21/1907 81 y.o.  Admit date: 08/30/2014 Referring Physician Dr. Fritzi Mandes Primary Physician   Primary Cardiologist   Reason for Consultation Atypical chest pain.  HPI: Patient is a 81 year old female with out prior cardiac history who was admitted with complaints of midsternal chest pain.  Patient is fairly active for advanced age.  She was admitted as ruled out from myocardia infarction.  Echocardiogram revealed no significant wall motion abnormality.  She has improved since admission.  She denies syncope or presyncope.  ROS Review of Systems - History obtained from chart review and the patient General ROS: positive for  - Chest pain Respiratory ROS: negative Cardiovascular ROS: positive for - chest pain Gastrointestinal ROS: no abdominal pain, change in bowel habits, or black or bloody stools Musculoskeletal ROS: negative Neurological ROS: no TIA or stroke symptoms   Past Medical History  Diagnosis Date  . Cancer     breast    History reviewed. No pertinent family history.  Social History   Social History  . Marital Status: Widowed    Spouse Name: N/A  . Number of Children: N/A  . Years of Education: N/A   Occupational History  . Not on file.   Social History Main Topics  . Smoking status: Never Smoker   . Smokeless tobacco: Not on file  . Alcohol Use: No  . Drug Use: Not on file  . Sexual Activity: Not on file   Other Topics Concern  . Not on file   Social History Narrative  . No narrative on file    History reviewed. No pertinent past surgical history.   No prescriptions prior to admission    Physical Exam: Blood pressure 167/69, pulse 85, temperature 97.7 F (36.5 C), temperature source Oral, resp. rate 18, height 4\' 11"  (1.499 m), weight 62.46 kg (137 lb 11.2 oz), SpO2 97 %.    General appearance: alert and  cooperative Chest wall: no tenderness Cardio: regular rate and rhythm, S1, S2 normal, no murmur, click, rub or gallop GI: soft, non-tender; bowel sounds normal; no masses,  no organomegaly Pulses: 2+ and symmetric Neurologic: Grossly normal Labs:   Lab Results  Component Value Date   WBC 12.5* 08/30/2014   HGB 13.4 08/30/2014   HCT 40.2 08/30/2014   MCV 89.7 08/30/2014   PLT 178 08/30/2014    Recent Labs Lab 08/30/14 1340  NA 139  K 4.3  CL 103  CO2 26  BUN 26*  CREATININE 1.10*  CALCIUM 9.4  PROT 7.6  BILITOT 0.8  ALKPHOS 63  ALT 11*  AST 26  GLUCOSE 114*   Lab Results  Component Value Date   TROPONINI 0.04* 08/31/2014      Radiology:   No acute process EKG:  Sinus rhythm with incomplete right bundle-branch block no ischemia  ASSESSMENT AND PLAN:   Patient is on 81 year old female with no prior cardiac history admitted with atypical chest pain.  She is quite active working in her yard.  She had some chest wall tenderness.  She has improved since admission has ruled out from myocardia infarction.  Echocardiogram revealed preserved LV function no significant valvular abnormalities.  Will continue with medical therapy.  Would agree with discharge in outpatient follow-up as needed. Signed: Teodoro Spray MD, [facility] 08/31/2014, 4:28 PM

## 2014-08-31 NOTE — Progress Notes (Signed)
Notified physician of patient's condition. Patient is confused, she has removed telemetry and iv access. Pt is refusing care and medication. Family has been called in to help to help reorient.

## 2015-02-19 DIAGNOSIS — H903 Sensorineural hearing loss, bilateral: Secondary | ICD-10-CM | POA: Diagnosis not present

## 2015-02-19 DIAGNOSIS — R040 Epistaxis: Secondary | ICD-10-CM | POA: Diagnosis not present

## 2015-03-18 DIAGNOSIS — H6123 Impacted cerumen, bilateral: Secondary | ICD-10-CM | POA: Diagnosis not present

## 2015-03-18 DIAGNOSIS — H903 Sensorineural hearing loss, bilateral: Secondary | ICD-10-CM | POA: Diagnosis not present

## 2015-09-28 DIAGNOSIS — H6123 Impacted cerumen, bilateral: Secondary | ICD-10-CM | POA: Diagnosis not present

## 2015-09-28 DIAGNOSIS — H903 Sensorineural hearing loss, bilateral: Secondary | ICD-10-CM | POA: Diagnosis not present

## 2015-10-22 ENCOUNTER — Emergency Department: Payer: PPO

## 2015-10-22 ENCOUNTER — Emergency Department
Admission: EM | Admit: 2015-10-22 | Discharge: 2015-10-22 | Disposition: A | Discharge status: Home / Self Care | Payer: PPO | Attending: Emergency Medicine | Admitting: Emergency Medicine

## 2015-10-22 DIAGNOSIS — Y92018 Other place in single-family (private) house as the place of occurrence of the external cause: Secondary | ICD-10-CM | POA: Insufficient documentation

## 2015-10-22 DIAGNOSIS — Z7982 Long term (current) use of aspirin: Secondary | ICD-10-CM | POA: Diagnosis not present

## 2015-10-22 DIAGNOSIS — Z23 Encounter for immunization: Secondary | ICD-10-CM | POA: Insufficient documentation

## 2015-10-22 DIAGNOSIS — S0291XA Unspecified fracture of skull, initial encounter for closed fracture: Secondary | ICD-10-CM

## 2015-10-22 DIAGNOSIS — Z853 Personal history of malignant neoplasm of breast: Secondary | ICD-10-CM | POA: Diagnosis not present

## 2015-10-22 DIAGNOSIS — Y999 Unspecified external cause status: Secondary | ICD-10-CM | POA: Insufficient documentation

## 2015-10-22 DIAGNOSIS — S199XXA Unspecified injury of neck, initial encounter: Secondary | ICD-10-CM | POA: Diagnosis not present

## 2015-10-22 DIAGNOSIS — S0282XA Fracture of other specified skull and facial bones, left side, initial encounter for closed fracture: Secondary | ICD-10-CM | POA: Diagnosis not present

## 2015-10-22 DIAGNOSIS — W19XXXA Unspecified fall, initial encounter: Secondary | ICD-10-CM | POA: Diagnosis not present

## 2015-10-22 DIAGNOSIS — S0181XA Laceration without foreign body of other part of head, initial encounter: Secondary | ICD-10-CM | POA: Insufficient documentation

## 2015-10-22 DIAGNOSIS — S12000A Unspecified displaced fracture of first cervical vertebra, initial encounter for closed fracture: Secondary | ICD-10-CM | POA: Diagnosis not present

## 2015-10-22 DIAGNOSIS — W01198A Fall on same level from slipping, tripping and stumbling with subsequent striking against other object, initial encounter: Secondary | ICD-10-CM | POA: Diagnosis not present

## 2015-10-22 DIAGNOSIS — Y9389 Activity, other specified: Secondary | ICD-10-CM | POA: Insufficient documentation

## 2015-10-22 DIAGNOSIS — S02113A Unspecified occipital condyle fracture, initial encounter for closed fracture: Secondary | ICD-10-CM | POA: Diagnosis not present

## 2015-10-22 DIAGNOSIS — S0990XA Unspecified injury of head, initial encounter: Secondary | ICD-10-CM | POA: Diagnosis not present

## 2015-10-22 LAB — URINALYSIS COMPLETE WITH MICROSCOPIC (ARMC ONLY)
Bilirubin Urine: NEGATIVE
GLUCOSE, UA: NEGATIVE mg/dL
Ketones, ur: NEGATIVE mg/dL
LEUKOCYTES UA: NEGATIVE
Nitrite: NEGATIVE
Protein, ur: NEGATIVE mg/dL
Specific Gravity, Urine: 1.006 (ref 1.005–1.030)
pH: 7 (ref 5.0–8.0)

## 2015-10-22 MED ORDER — ACETAMINOPHEN 500 MG PO TABS
500.0000 mg | ORAL_TABLET | Freq: Once | ORAL | Status: AC
  Administered 2015-10-22: 500 mg via ORAL
  Filled 2015-10-22: qty 1

## 2015-10-22 MED ORDER — OXYMETAZOLINE HCL 0.05 % NA SOLN
1.0000 | Freq: Once | NASAL | Status: AC
  Administered 2015-10-22: 1 via NASAL

## 2015-10-22 MED ORDER — OXYMETAZOLINE HCL 0.05 % NA SOLN
NASAL | Status: AC
  Administered 2015-10-22: 1 via NASAL
  Filled 2015-10-22: qty 15

## 2015-10-22 MED ORDER — LIDOCAINE-EPINEPHRINE (PF) 1 %-1:200000 IJ SOLN
30.0000 mL | Freq: Once | INTRAMUSCULAR | Status: AC
  Administered 2015-10-22: 5 mL via INTRADERMAL

## 2015-10-22 MED ORDER — LIDOCAINE-EPINEPHRINE (PF) 1 %-1:200000 IJ SOLN
INTRAMUSCULAR | Status: AC
  Administered 2015-10-22: 5 mL via INTRADERMAL
  Filled 2015-10-22: qty 30

## 2015-10-22 MED ORDER — LIDOCAINE-EPINEPHRINE 1 %-1:100000 IJ SOLN: 30.0000 mL | Freq: Once | INTRAMUSCULAR | Status: DC

## 2015-10-22 MED ORDER — TETANUS-DIPHTH-ACELL PERTUSSIS 5-2.5-18.5 LF-MCG/0.5 IM SUSP
0.5000 mL | Freq: Once | INTRAMUSCULAR | Status: AC
  Administered 2015-10-22: 0.5 mL via INTRAMUSCULAR
  Filled 2015-10-22: qty 0.5

## 2015-10-22 NOTE — ED Notes (Signed)
Pt assisted to bathroom with this RN and Beverlee Nims, EDT.

## 2015-10-22 NOTE — ED Notes (Signed)
Pt son given crackers and diet coke.

## 2015-10-22 NOTE — ED Notes (Signed)
Pt assisted to toilet with this RN 

## 2015-10-22 NOTE — ED Notes (Signed)
Pt c/o of head pain from fall. EMS states 3 inch lac above R eye. Pt has bandage around head to control bleeding. Pt has nosebleed from fall, keeps blowing nose even though told to not. Pt denies taking a blood thinner.

## 2015-10-22 NOTE — ED Notes (Signed)
Pt taken to CT via stretcher. Son at bedside.  

## 2015-10-22 NOTE — ED Triage Notes (Signed)
Pt presents to ED via [facility] from home after a fall outside on some stepping stones. Per EMS pt has about a 3 inch lac above R eye. Pt has nose bleed, bleeding controlled. Bandage around head to control bleeding. Pt is alert and oriented. Son at bedside. Pt denies LOC, denies dizziness prior to fall.

## 2015-10-22 NOTE — ED Provider Notes (Signed)
[facility] Emergency Department Provider Note ____________________________________________   I have reviewed the triage vital signs and the triage nursing note.  HISTORY  Chief Complaint Fall and Head Laceration   Historian Patient and son  HPI Brandi Nelson is a 82 y.o. female who lives at home, tripped and fell striking her right forehead where she has a significant laceration. Her son had just brought her home from a shopping trip and he turned around after he heard a fall. She was able to stand up and is not reporting any pain in her extremities. She has a large laceration to her right forehead which is painful, moderate. She has some abrasion to the right side of her face and nose.     Past Medical History:  Diagnosis Date  . Cancer [facility])    breast    Patient Active Problem List   Diagnosis Date Noted  . Chest pain 08/30/2014  . Elevated troponin 08/30/2014    History reviewed. No pertinent surgical history.  Prior to Admission medications   Medication Sig Start Date End Date Taking? Authorizing Provider  aspirin EC 81 MG EC tablet Take 1 tablet (81 mg total) by mouth daily. 08/31/14   Fritzi Mandes, MD  metoprolol succinate (TOPROL-XL) 25 MG 24 hr tablet Take 0.5 tablets (12.5 mg total) by mouth daily. 08/31/14   Fritzi Mandes, MD  nitroGLYCERIN (NITROSTAT) 0.4 MG SL tablet Place 1 tablet (0.4 mg total) under the tongue every 5 (five) minutes as needed for chest pain. 08/31/14   Fritzi Mandes, MD    Allergies  Allergen Reactions  . Penicillin G     Other reaction(s): Unknown    History reviewed. No pertinent family history.  Social History Social History  Substance Use Topics  . Smoking status: Never Smoker  . Smokeless tobacco: Not on file  . Alcohol use No    Review of Systems  Constitutional: Negative for Recent illnesses. Eyes:No acute vision changes. ENT: Negative for sore throat.  Nosebleed that is now essentially  stopped. Cardiovascular: Negative for chest pain. Respiratory: Negative for shortness of breath. Gastrointestinal: Negative for abdominal pain. Genitourinary: Negative for dysuria. Musculoskeletal: Negative for back pain. Skin: Negative for rash. Neurological: Positive for headache, at the site at the right forehead where she has a laceration. 10 point Review of Systems otherwise negative ____________________________________________   PHYSICAL EXAM:  VITAL SIGNS: ED Triage Vitals [10/22/15 1417]  Enc Vitals Group     BP (!) 192/155     Pulse Rate 84     Resp 18     Temp 97.9 F (36.6 C)     Temp Source Oral     SpO2 97 %     Weight      Height      Head Circumference      Peak Flow      Pain Score      Pain Loc      Pain Edu?      Excl. in [place]?      Constitutional: Alert and Cooperative. Well appearing and in no distress. HEENT   Head: Large and deep right forehead laceration.      Eyes: Conjunctivae are normal. PERRL. Normal extraocular movements.      Ears:         Nose: No congestion/rhinnorhea.   Mouth/Throat: Mucous membranes are moist.   Neck: No stridor.  Mild tenderness to palpation in midline C-spine without step-off. Cardiovascular/Chest: Normal rate, regular rhythm.  No murmurs, rubs, or gallops. Respiratory: Normal respiratory effort without tachypnea nor retractions. Breath sounds are clear and equal bilaterally. No wheezes/rales/rhonchi. Gastrointestinal: Soft. No distention, no guarding, no rebound. Nontender.    Genitourinary/rectal:Deferred Musculoskeletal: Pelvis stable. Nontender with normal range of motion in all extremities. No joint effusions.  No lower extremity tenderness.  No edema. Neurologic:  Normal speech and language. No gross or focal neurologic deficits are appreciated. Skin:  Skin is warm, dry and intact. No rash noted. Psychiatric: Mood and affect are normal. Speech and behavior are normal. Patient exhibits appropriate  insight and judgment.   ____________________________________________  LABS (pertinent positives/negatives)  Labs Reviewed  URINALYSIS COMPLETEWITH MICROSCOPIC ([facility] ONLY) - Abnormal; Notable for the following:       Result Value   Color, Urine STRAW (*)    APPearance CLEAR (*)    Hgb urine dipstick 2+ (*)    Bacteria, UA RARE (*)    Squamous Epithelial / LPF 0-5 (*)    All other components within normal limits    ____________________________________________    EKG I, Lisa Roca, MD, the attending physician have personally viewed and interpreted all ECGs.  82 bpm. Normal sinus rhythm. Nonspecific interventricular conduction delay. Normal axis. Nonspecific ST and T-wave ____________________________________________  RADIOLOGY All Xrays were viewed by me. Imaging interpreted by Radiologist.  CT head and cervical spine noncontrast:  IMPRESSION: 1. Minimally displaced fracture within the medial aspect of the left occipital condyle. Underlying atlantoaxial joint space remains normally aligned. No additional fracture or subluxation within the cervical spine. Degenerative changes of the mid and lower cervical spine, as detailed above. 2. Soft tissue edema/lacerations overlying the right orbit and right lower frontal bone. No underlying fracture in this area. 3. No facial bone fracture seen. 4. No acute intracranial abnormality. No intracranial mass, hemorrhage or edema. No skull fracture seen. These results were called by telephone at the time of interpretation on 10/22/2015 at 4:05 pm to Dr. Lisa Roca , who verbally acknowledged these results.  ADDENDUM: After further review, there is an additional minimally displaced fracture within the left lateral aspects of the C1 vertebral body, at the base of the left transverse process (series 18, image 25).  The minimally displaced fracture within the adjacent left occipital condyles is also best seen on this series 18, at  images 23 through 25.  These additional results were called by telephone at the time of interpretation on 10/22/2015 at 5:25 pm to Dr. Lisa Roca , who verbally acknowledged these results. __________________________________________  PROCEDURES  Procedure(s) performed: LACERATION REPAIR Performed by: Lisa Roca Authorized by: Lisa Roca Consent: Verbal consent obtained. Risks and benefits: risks, benefits and alternatives were discussed Consent given by: patient Patient identity confirmed: provided demographic data Prepped and Draped in normal sterile fashion Wound explored  Laceration Location: Right forehead  Laceration Length: 20 cm  No Foreign Bodies seen or palpated  Anesthesia: local infiltration  Local anesthetic: lidocaine 1% with epinephrine  Anesthetic total: 6 ml  Irrigation method: syringe Amount of cleaning: standard  Skin closure: 6-0 prolene external 4-0 vicryl internally  Number of sutures: 11 external, 1 internal  Technique: interrupted  Patient tolerance: Patient tolerated the procedure well with no immediate complications.  Critical Care performed: None  ____________________________________________   ED COURSE / ASSESSMENT AND PLAN  Pertinent labs & imaging results that were available during my care of the patient were reviewed by me and considered in my medical decision making (see chart for details).   Ms. Biel  had a fall and has a large deep laceration to her right forehead. This is a presumed to be a mechanical fall as she was coming in after her son after a shopping outing. She is on aspirin.    She is 82 years old and has been living independently. Her sons are here with her.  CT of the head and cervical spine and maxillofacial show left occipital condyle fracture and body of C1 fracture which I discussed both of these fractures with the on-call neurosurgeon at [facility] who recommended discharge home, normal  activity.  Her facial lacerations were repaired, a nonadherent dressing was applied, and an Ace wrap was placed around the head as well. Afrin was utilized to help prevent recurrence of nosebleed.    CONSULTATIONS:  Dr. Cyndy Freeze, neurosurgery at [facility] - discussed occipital condyle fracture and body of C1 fracture near the transverse process, he recommends no intervention, no need for transfer, no need for cervical collar. He recommends discharge home to normal activity.  Patient / Family / Caregiver informed of clinical course, medical decision-making process, and agree with plan.   I discussed return precautions, follow-up instructions, and discharge instructions with patient and/or family.   ___________________________________________   FINAL CLINICAL IMPRESSION(S) / ED DIAGNOSES   Final diagnoses:  Facial laceration, initial encounter  Closed fracture of skull, unspecified bone, initial encounter ([place])  Closed fracture of first cervical vertebra, unspecified fracture morphology, initial encounter [facility])              Note: This dictation was prepared with Dragon dictation. Any transcriptional errors that result from this process are unintentional    Lisa Roca, MD 10/22/15 2115

## 2015-10-22 NOTE — ED Notes (Signed)
Pt urinated in toilet assisted by this RN. 3rd urination while in ED

## 2015-10-22 NOTE — ED Notes (Signed)
Pt's nose looks slightly crooked, not straight. Pt nose looks like a bruise may be forming, still bleeding slightly. Pt son informed that if it keeps bleeding, we can put something on her nose to help.

## 2015-10-22 NOTE — ED Notes (Signed)
Pt returned from CT via stretcher.

## 2015-10-22 NOTE — Discharge Instructions (Signed)
You need to have your stitches (11) checked for possible removal in about 7 days at your primary doctor's office.  Return to the emergency department for any uncontrolled bleeding, or any altered mental status, weakness or numbness or any other symptoms concerning to you.

## 2015-10-22 NOTE — ED Notes (Signed)
C-collar placed on pt. Pt has negative verbalization towards c-collar being on.

## 2015-10-26 DIAGNOSIS — S0181XD Laceration without foreign body of other part of head, subsequent encounter: Secondary | ICD-10-CM | POA: Diagnosis not present

## 2015-10-29 DIAGNOSIS — F0280 Dementia in other diseases classified elsewhere without behavioral disturbance: Secondary | ICD-10-CM | POA: Diagnosis not present

## 2015-10-29 DIAGNOSIS — L03211 Cellulitis of face: Secondary | ICD-10-CM | POA: Diagnosis not present

## 2015-10-29 DIAGNOSIS — S0181XD Laceration without foreign body of other part of head, subsequent encounter: Secondary | ICD-10-CM | POA: Diagnosis not present

## 2015-10-29 DIAGNOSIS — G301 Alzheimer's disease with late onset: Secondary | ICD-10-CM | POA: Diagnosis not present

## 2015-10-31 DIAGNOSIS — Z9181 History of falling: Secondary | ICD-10-CM | POA: Diagnosis not present

## 2015-10-31 DIAGNOSIS — I2510 Atherosclerotic heart disease of native coronary artery without angina pectoris: Secondary | ICD-10-CM | POA: Diagnosis not present

## 2015-10-31 DIAGNOSIS — S0181XD Laceration without foreign body of other part of head, subsequent encounter: Secondary | ICD-10-CM | POA: Diagnosis not present

## 2015-10-31 DIAGNOSIS — S12000D Unspecified displaced fracture of first cervical vertebra, subsequent encounter for fracture with routine healing: Secondary | ICD-10-CM | POA: Diagnosis not present

## 2015-10-31 DIAGNOSIS — S02109D Fracture of base of skull, unspecified side, subsequent encounter for fracture with routine healing: Secondary | ICD-10-CM | POA: Diagnosis not present

## 2015-10-31 DIAGNOSIS — Z853 Personal history of malignant neoplasm of breast: Secondary | ICD-10-CM | POA: Diagnosis not present

## 2015-10-31 DIAGNOSIS — J45909 Unspecified asthma, uncomplicated: Secondary | ICD-10-CM | POA: Diagnosis not present

## 2015-11-02 DIAGNOSIS — S0181XD Laceration without foreign body of other part of head, subsequent encounter: Secondary | ICD-10-CM | POA: Diagnosis not present

## 2015-11-04 DIAGNOSIS — Z853 Personal history of malignant neoplasm of breast: Secondary | ICD-10-CM | POA: Diagnosis not present

## 2015-11-04 DIAGNOSIS — S12000D Unspecified displaced fracture of first cervical vertebra, subsequent encounter for fracture with routine healing: Secondary | ICD-10-CM | POA: Diagnosis not present

## 2015-11-04 DIAGNOSIS — S0181XD Laceration without foreign body of other part of head, subsequent encounter: Secondary | ICD-10-CM | POA: Diagnosis not present

## 2015-11-04 DIAGNOSIS — I2510 Atherosclerotic heart disease of native coronary artery without angina pectoris: Secondary | ICD-10-CM | POA: Diagnosis not present

## 2015-11-04 DIAGNOSIS — Z9181 History of falling: Secondary | ICD-10-CM | POA: Diagnosis not present

## 2015-11-04 DIAGNOSIS — J45909 Unspecified asthma, uncomplicated: Secondary | ICD-10-CM | POA: Diagnosis not present

## 2015-11-04 DIAGNOSIS — S02109D Fracture of base of skull, unspecified side, subsequent encounter for fracture with routine healing: Secondary | ICD-10-CM | POA: Diagnosis not present

## 2015-11-11 DIAGNOSIS — S12000D Unspecified displaced fracture of first cervical vertebra, subsequent encounter for fracture with routine healing: Secondary | ICD-10-CM | POA: Diagnosis not present

## 2015-11-11 DIAGNOSIS — S0181XD Laceration without foreign body of other part of head, subsequent encounter: Secondary | ICD-10-CM | POA: Diagnosis not present

## 2015-11-11 DIAGNOSIS — I2510 Atherosclerotic heart disease of native coronary artery without angina pectoris: Secondary | ICD-10-CM | POA: Diagnosis not present

## 2015-11-11 DIAGNOSIS — Z853 Personal history of malignant neoplasm of breast: Secondary | ICD-10-CM | POA: Diagnosis not present

## 2015-11-11 DIAGNOSIS — S02109D Fracture of base of skull, unspecified side, subsequent encounter for fracture with routine healing: Secondary | ICD-10-CM | POA: Diagnosis not present

## 2015-11-11 DIAGNOSIS — J45909 Unspecified asthma, uncomplicated: Secondary | ICD-10-CM | POA: Diagnosis not present

## 2015-11-11 DIAGNOSIS — Z9181 History of falling: Secondary | ICD-10-CM | POA: Diagnosis not present

## 2015-11-29 ENCOUNTER — Emergency Department: Payer: PPO

## 2015-11-29 ENCOUNTER — Inpatient Hospital Stay
Admission: EM | Admit: 2015-11-29 | Discharge: 2015-12-01 | DRG: 202 | Disposition: A | Discharge status: Home Health | Payer: PPO | Attending: Internal Medicine | Admitting: Internal Medicine

## 2015-11-29 ENCOUNTER — Encounter: Payer: Self-pay | Admitting: Emergency Medicine

## 2015-11-29 DIAGNOSIS — J9601 Acute respiratory failure with hypoxia: Secondary | ICD-10-CM | POA: Diagnosis not present

## 2015-11-29 DIAGNOSIS — I509 Heart failure, unspecified: Secondary | ICD-10-CM | POA: Diagnosis not present

## 2015-11-29 DIAGNOSIS — H3530 Unspecified macular degeneration: Secondary | ICD-10-CM | POA: Diagnosis present

## 2015-11-29 DIAGNOSIS — Z7982 Long term (current) use of aspirin: Secondary | ICD-10-CM | POA: Diagnosis not present

## 2015-11-29 DIAGNOSIS — J45909 Unspecified asthma, uncomplicated: Secondary | ICD-10-CM | POA: Diagnosis present

## 2015-11-29 DIAGNOSIS — H9190 Unspecified hearing loss, unspecified ear: Secondary | ICD-10-CM | POA: Diagnosis not present

## 2015-11-29 DIAGNOSIS — J40 Bronchitis, not specified as acute or chronic: Secondary | ICD-10-CM

## 2015-11-29 DIAGNOSIS — J810 Acute pulmonary edema: Secondary | ICD-10-CM

## 2015-11-29 DIAGNOSIS — Z79899 Other long term (current) drug therapy: Secondary | ICD-10-CM | POA: Diagnosis not present

## 2015-11-29 DIAGNOSIS — R0602 Shortness of breath: Secondary | ICD-10-CM | POA: Diagnosis not present

## 2015-11-29 DIAGNOSIS — Z9010 Acquired absence of unspecified breast and nipple: Secondary | ICD-10-CM

## 2015-11-29 DIAGNOSIS — M6281 Muscle weakness (generalized): Secondary | ICD-10-CM

## 2015-11-29 DIAGNOSIS — J9690 Respiratory failure, unspecified, unspecified whether with hypoxia or hypercapnia: Secondary | ICD-10-CM | POA: Diagnosis present

## 2015-11-29 DIAGNOSIS — Z853 Personal history of malignant neoplasm of breast: Secondary | ICD-10-CM

## 2015-11-29 DIAGNOSIS — R262 Difficulty in walking, not elsewhere classified: Secondary | ICD-10-CM

## 2015-11-29 DIAGNOSIS — J209 Acute bronchitis, unspecified: Principal | ICD-10-CM | POA: Diagnosis present

## 2015-11-29 DIAGNOSIS — R062 Wheezing: Secondary | ICD-10-CM | POA: Diagnosis not present

## 2015-11-29 DIAGNOSIS — J441 Chronic obstructive pulmonary disease with (acute) exacerbation: Secondary | ICD-10-CM | POA: Diagnosis present

## 2015-11-29 HISTORY — DX: Unspecified hearing loss, unspecified ear: H91.90

## 2015-11-29 HISTORY — DX: Unspecified macular degeneration: H35.30

## 2015-11-29 LAB — COMPREHENSIVE METABOLIC PANEL
ALK PHOS: 77 U/L (ref 38–126)
ALT: 12 U/L — AB (ref 14–54)
ANION GAP: 9 (ref 5–15)
AST: 28 U/L (ref 15–41)
Albumin: 4 g/dL (ref 3.5–5.0)
BUN: 16 mg/dL (ref 6–20)
CO2: 25 mmol/L (ref 22–32)
Calcium: 8.8 mg/dL — ABNORMAL LOW (ref 8.9–10.3)
Chloride: 104 mmol/L (ref 101–111)
Creatinine, Ser: 0.93 mg/dL (ref 0.44–1.00)
GFR, EST AFRICAN AMERICAN: 55 mL/min — AB (ref >60)
GFR, EST NON AFRICAN AMERICAN: 47 mL/min — AB (ref >60)
GLUCOSE: 122 mg/dL — AB (ref 65–99)
POTASSIUM: 4.2 mmol/L (ref 3.5–5.1)
Sodium: 138 mmol/L (ref 135–145)
Total Bilirubin: 0.6 mg/dL (ref 0.3–1.2)
Total Protein: 7.3 g/dL (ref 6.5–8.1)

## 2015-11-29 LAB — CBC
HCT: 37.2 % (ref 35.0–47.0)
Hemoglobin: 12.5 g/dL (ref 12.0–16.0)
MCH: 29.9 pg (ref 26.0–34.0)
MCHC: 33.7 g/dL (ref 32.0–36.0)
MCV: 88.6 fL (ref 80.0–100.0)
PLATELETS: 219 10*3/uL (ref 150–440)
RBC: 4.2 MIL/uL (ref 3.80–5.20)
RDW: 14.8 % — ABNORMAL HIGH (ref 11.5–14.5)
WBC: 7.3 10*3/uL (ref 3.6–11.0)

## 2015-11-29 LAB — BRAIN NATRIURETIC PEPTIDE: B Natriuretic Peptide: 140 pg/mL — ABNORMAL HIGH (ref 0.0–100.0)

## 2015-11-29 LAB — TROPONIN I: Troponin I: <0.03 ng/mL (ref <0.03)

## 2015-11-29 MED ORDER — ALBUTEROL SULFATE (2.5 MG/3ML) 0.083% IN NEBU
5.0000 mg | INHALATION_SOLUTION | Freq: Once | RESPIRATORY_TRACT | Status: AC
  Administered 2015-11-29: 5 mg via RESPIRATORY_TRACT
  Filled 2015-11-29: qty 6

## 2015-11-29 MED ORDER — FUROSEMIDE 10 MG/ML IJ SOLN
40.0000 mg | Freq: Once | INTRAMUSCULAR | Status: AC
  Administered 2015-11-29: 40 mg via INTRAVENOUS
  Filled 2015-11-29: qty 4

## 2015-11-29 MED ORDER — ACETAMINOPHEN 325 MG PO TABS
650.0000 mg | ORAL_TABLET | Freq: Four times a day (QID) | ORAL | Status: DC | PRN
  Filled 2015-11-29: qty 2

## 2015-11-29 MED ORDER — ONDANSETRON HCL 4 MG/2ML IJ SOLN: 4.0000 mg | Freq: Four times a day (QID) | INTRAMUSCULAR | Status: DC | PRN

## 2015-11-29 MED ORDER — METHYLPREDNISOLONE SODIUM SUCC 125 MG IJ SOLR
60.0000 mg | INTRAMUSCULAR | Status: DC
  Administered 2015-11-29: 22:00:00 60 mg via INTRAVENOUS
  Filled 2015-11-29: qty 2

## 2015-11-29 MED ORDER — IPRATROPIUM-ALBUTEROL 0.5-2.5 (3) MG/3ML IN SOLN
3.0000 mL | RESPIRATORY_TRACT | Status: DC
  Administered 2015-11-30 (×3): 3 mL via RESPIRATORY_TRACT
  Filled 2015-11-29 (×4): qty 3

## 2015-11-29 MED ORDER — METHYLPREDNISOLONE SODIUM SUCC 125 MG IJ SOLR
125.0000 mg | Freq: Once | INTRAMUSCULAR | Status: AC
  Administered 2015-11-29: 125 mg via INTRAVENOUS
  Filled 2015-11-29: qty 2

## 2015-11-29 MED ORDER — SODIUM CHLORIDE 0.9% FLUSH
3.0000 mL | Freq: Two times a day (BID) | INTRAVENOUS | Status: DC
  Administered 2015-11-29 – 2015-12-01 (×3): 3 mL via INTRAVENOUS

## 2015-11-29 MED ORDER — HYDRALAZINE HCL 20 MG/ML IJ SOLN: 10.0000 mg | Freq: Four times a day (QID) | INTRAMUSCULAR | Status: DC | PRN

## 2015-11-29 MED ORDER — ENOXAPARIN SODIUM 30 MG/0.3ML ~~LOC~~ SOLN
30.0000 mg | Freq: Every day | SUBCUTANEOUS | Status: DC
  Administered 2015-11-29 – 2015-11-30 (×2): 30 mg via SUBCUTANEOUS
  Filled 2015-11-29 (×2): qty 0.3

## 2015-11-29 MED ORDER — AMLODIPINE BESYLATE 5 MG PO TABS
2.5000 mg | ORAL_TABLET | Freq: Every day | ORAL | Status: DC
  Administered 2015-11-30: 2.5 mg via ORAL
  Filled 2015-11-29 (×3): qty 1

## 2015-11-29 MED ORDER — ACETAMINOPHEN 650 MG RE SUPP: 650.0000 mg | Freq: Four times a day (QID) | RECTAL | Status: DC | PRN

## 2015-11-29 MED ORDER — IPRATROPIUM-ALBUTEROL 0.5-2.5 (3) MG/3ML IN SOLN
9.0000 mL | Freq: Once | RESPIRATORY_TRACT | Status: AC
  Administered 2015-11-29: 9 mL via RESPIRATORY_TRACT
  Filled 2015-11-29: qty 9

## 2015-11-29 MED ORDER — FUROSEMIDE 10 MG/ML IJ SOLN
20.0000 mg | Freq: Every day | INTRAMUSCULAR | Status: DC
  Administered 2015-11-30: 20 mg via INTRAVENOUS
  Filled 2015-11-29: qty 2

## 2015-11-29 MED ORDER — AZITHROMYCIN 500 MG IV SOLR
500.0000 mg | Freq: Once | INTRAVENOUS | Status: AC
  Administered 2015-11-29: 500 mg via INTRAVENOUS
  Filled 2015-11-29: qty 500

## 2015-11-29 MED ORDER — DEXTROSE 5 % IV SOLN
500.0000 mg | INTRAVENOUS | Status: DC
  Filled 2015-11-29: qty 500

## 2015-11-29 MED ORDER — ASPIRIN EC 81 MG PO TBEC
81.0000 mg | DELAYED_RELEASE_TABLET | Freq: Every day | ORAL | Status: DC
  Administered 2015-11-30 – 2015-12-01 (×2): 81 mg via ORAL
  Filled 2015-11-29 (×2): qty 1

## 2015-11-29 MED ORDER — ONDANSETRON HCL 4 MG PO TABS: 4.0000 mg | ORAL_TABLET | Freq: Four times a day (QID) | ORAL | Status: DC | PRN

## 2015-11-29 MED ORDER — IPRATROPIUM-ALBUTEROL 0.5-2.5 (3) MG/3ML IN SOLN
RESPIRATORY_TRACT | Status: AC
  Administered 2015-11-29: 9 mL
  Filled 2015-11-29: qty 6

## 2015-11-29 NOTE — ED Triage Notes (Signed)
Per son, pt with sob and wheezing for several days.  Congestion.

## 2015-11-29 NOTE — ED Provider Notes (Signed)
[facility] Emergency Department Provider Note  ____________________________________________   None    (approximate)  I have reviewed the triage vital signs and the nursing notes.   HISTORY  Chief Complaint Shortness of Breath   HPI Brandi Nelson is a 82 y.o. female with a history of breast cancer was presenting to the emergency department today with shortness of breath as well as cough over the past 2 days.  Patient denies any fever. Family members also have had an upper respiratory infection recently. Patient has been coughing but without any sputum production. Denies any pain. Denies any history of heart failure.   Past Medical History:  Diagnosis Date  . Cancer [facility])    breast    Patient Active Problem List   Diagnosis Date Noted  . Chest pain 08/30/2014  . Elevated troponin 08/30/2014    History reviewed. No pertinent surgical history.  Prior to Admission medications   Medication Sig Start Date End Date Taking? Authorizing Provider  aspirin EC 81 MG EC tablet Take 1 tablet (81 mg total) by mouth daily. 08/31/14   Fritzi Mandes, MD    Allergies Penicillin g  No family history on file.  Social History Social History  Substance Use Topics  . Smoking status: Never Smoker  . Smokeless tobacco: Never Used  . Alcohol use No    Review of Systems Constitutional: No fever/chills Eyes: No visual changes. ENT: No sore throat. Cardiovascular: Denies chest pain. Respiratory: As above Gastrointestinal: No abdominal pain.  No nausea, no vomiting.  No diarrhea.  No constipation. Genitourinary: Negative for dysuria. Musculoskeletal: Negative for back pain. Skin: Negative for rash. Neurological: Negative for headaches, focal weakness or numbness.  10-point ROS otherwise negative.  ____________________________________________   PHYSICAL EXAM:  VITAL SIGNS: ED Triage Vitals  Enc Vitals Group     BP 11/29/15 1351 (!) 181/88     Pulse  Rate 11/29/15 1351 96     Resp 11/29/15 1351 (!) 27     Temp 11/29/15 1351 97.8 F (36.6 C)     Temp Source 11/29/15 1351 Oral     SpO2 11/29/15 1351 92 %     Weight 11/29/15 1352 134 lb (60.8 kg)     Height 11/29/15 1352 4\' 11"  (1.499 m)     Head Circumference --      Peak Flow --      Pain Score --      Pain Loc --      Pain Edu? --      Excl. in [place]? --     Constitutional: Alert and oriented. No acute distress. Speaks in full sentences although does have labored respirations. Eyes: Conjunctivae are normal. PERRL. EOMI. Head: Atraumatic. Nose: No congestion/rhinnorhea. Mouth/Throat: Mucous membranes are moist.   Neck: No stridor.   Cardiovascular: Tachycardic, regular rhythm. Grossly normal heart sounds.   Respiratory: Labored respirations with supraclavicular retractions. Wheezing throughout with rales to the bases bilaterally but greater in the right. Gastrointestinal: Soft and nontender. No distention. Musculoskeletal: No lower extremity tenderness nor edema.  Neurologic:  Normal speech and language. No gross focal neurologic deficits are appreciated.  Skin:  Skin is warm, dry and intact. No rash noted. Psychiatric: Mood and affect are normal. Speech and behavior are normal.  ____________________________________________   LABS (all labs ordered are listed, but only abnormal results are displayed)  Labs Reviewed  CBC - Abnormal; Notable for the following:       Result Value   RDW  14.8 (*)    All other components within normal limits  COMPREHENSIVE METABOLIC PANEL - Abnormal; Notable for the following:    Glucose, Bld 122 (*)    Calcium 8.8 (*)    ALT 12 (*)    GFR calc non Af Amer 47 (*)    GFR calc Af Amer 55 (*)    All other components within normal limits  BRAIN NATRIURETIC PEPTIDE - Abnormal; Notable for the following:    B Natriuretic Peptide 140.0 (*)    All other components within normal limits  TROPONIN I    ____________________________________________  EKG  ED ECG REPORT I, Doran Stabler, the attending physician, personally viewed and interpreted this ECG.   Date: 11/29/2015  EKG Time: 1430  Rate: 97  Rhythm: normal sinus rhythm  Axis: normal  Intervals:none  ST&T Change: No ST segment elevation or depression. T-wave inversions in V2 as well as V3. No significant change from EKG done on 10/22/2015. ____________________________________________  RADIOLOGY  Study Result   CLINICAL DATA:  Shortness of breath and wheezing for the past 2 days, nonsmoker.  EXAM: CHEST  2 VIEW  COMPARISON:  Chest x-ray and chest CT scan of August 30, 2014  FINDINGS: The lungs are adequately inflated. The interstitial markings are mildly increased. The cardiac silhouette is enlarged but stable. The pulmonary vascularity is mildly prominent. There is tortuosity of the ascending and descending thoracic aorta. There is calcification in the wall of the aortic arch. There are chronic deformities of the proximal humeri. There are partial compressions of lower thoracic vertebral bodies.  IMPRESSION: Cardiomegaly with mild pulmonary vascular prominence consistent with low-grade compensated CHF. There is no alveolar pneumonia. There is thoracic aortic atherosclerosis.   Electronically Signed   By: David  Martinique M.D.   On: 11/29/2015 15:04    ____________________________________________   PROCEDURES  Procedure(s) performed:  CRITICAL CARE Performed by: Doran Stabler   Total critical care time: 35 minutes  Critical care time was exclusive of separately billable procedures and treating other patients.  Critical care was necessary to treat or prevent imminent or life-threatening deterioration.  Critical care was time spent personally by me on the following activities: development of treatment plan with patient and/or surrogate as well as nursing, discussions with  consultants, evaluation of patient's response to treatment, examination of patient, obtaining history from patient or surrogate, ordering and performing treatments and interventions, ordering and review of laboratory studies, ordering and review of radiographic studies, pulse oximetry and re-evaluation of patient's condition.   Procedures  Critical Care performed:   ____________________________________________   INITIAL IMPRESSION / ASSESSMENT AND PLAN / ED COURSE  Pertinent labs & imaging results that were available during my care of the patient were reviewed by me and considered in my medical decision making (see chart for details).    Clinical Course    ----------------------------------------- 3:39 PM on 11/29/2015 -----------------------------------------  Patient was on BiPAP because of degree of laboring in her respirations. ----------------------------------------- 4:24 PM on 11/29/2015 -----------------------------------------  Patient mildly tender but tolerating the BiPAP well and with much less labored respirations. Plan to admit to the hospital. BNP of 140. More likely bronchitis with possible early pneumonia. Ordered azithromycin as well as steroids and DuoNeb. Signed out to Dr. Tressia Miners. I explained The plan as well as the recent information to the family who are understanding willing to comply. ____________________________________________   FINAL CLINICAL IMPRESSION(S) / ED DIAGNOSES  Rhonchorous with wheezing. Shortness of breath. Pulmonary edema.  NEW MEDICATIONS STARTED DURING THIS VISIT:  New Prescriptions   No medications on file     Note:  This document was prepared using Dragon voice recognition software and may include unintentional dictation errors.    Orbie Pyo, MD 11/29/15 [phone]

## 2015-11-29 NOTE — ED Notes (Signed)
Pt continues to rip BP cuff off when BP attempts to read. When BP was taken one time pt was tense and screaming and reading was not reliable. Pt educated on need for BP reading but continues to question the need for BP cuff.

## 2015-11-29 NOTE — ED Notes (Signed)
Pt assisted up from bed and to bathroom.

## 2015-11-29 NOTE — ED Notes (Signed)
SOB and wheezing X 2 days.

## 2015-11-29 NOTE — ED Notes (Signed)
Pt reports having pain with Bipap mask and reports anxiety while wearing mask. The mask has been removed and MD has been made aware. Pt placed on 3L and will continue to be monitored by this RN.

## 2015-11-29 NOTE — H&P (Addendum)
[place] [facility] [place] NAME: Brandi Nelson    MR#:  [id number]  DATE OF BIRTH:  10-16-1907  DATE OF ADMISSION:  11/29/2015  PRIMARY CARE PHYSICIAN: Madelyn Brunner, MD   REQUESTING/REFERRING PHYSICIAN: Dr. Larae Grooms  CHIEF COMPLAINT:   Chief Complaint  Patient presents with  . Shortness of Breath    HISTORY OF PRESENT ILLNESS:  Brandi Nelson  is a 82 y.o. female with a known history of breast cancer s/p mastectomy, macular degeneration and also hearing loss presents from home secondary to 2 day history of worsening shortness of breath. Exposed to son who has bronchitis for the last month. Started with cold, cough, sore throat, congestion. Turned into productive cough with chills and difficulty breathing which worsened this morning. Here she was tachypenic, hypoxic requiring Bipap initially. Now more stable and changed to nasal cannula. No nausea, vomiting or fevers. Complains of chest hurting due to coughing.  PAST MEDICAL HISTORY:   Past Medical History:  Diagnosis Date  . Cancer (HCC)    breast  . Hearing loss   . Macular degeneration     PAST SURGICAL HISTORY:   Past Surgical History:  Procedure Laterality Date  . MASTECTOMY      SOCIAL HISTORY:   Social History  Substance Use Topics  . Smoking status: Never Smoker  . Smokeless tobacco: Never Used  . Alcohol use No    FAMILY HISTORY:   Family History  Problem Relation Age of Onset  . Family history unknown: Yes    DRUG ALLERGIES:   Allergies  Allergen Reactions  . Penicillin G     Has patient had a PCN reaction causing immediate rash, facial/tongue/throat swelling, SOB or lightheadedness with hypotension: no  Has patient had a PCN reaction causing severe rash involving mucus membranes or skin necrosis: no Has patient had a PCN reaction that required hospitalization no Has patient had a PCN reaction occurring within the last 10 years: no If all  of the above answers are "NO", then may proceed with Cephalosporin use.     REVIEW OF SYSTEMS:   Review of Systems  Constitutional: Positive for malaise/fatigue. Negative for chills, fever and weight loss.  HENT: Positive for hearing loss. Negative for ear discharge, ear pain and nosebleeds.   Eyes: Positive for blurred vision. Negative for double vision and photophobia.  Respiratory: Positive for cough. Negative for hemoptysis, shortness of breath and wheezing.   Cardiovascular: Positive for orthopnea. Negative for chest pain, palpitations and leg swelling.  Gastrointestinal: Positive for nausea. Negative for abdominal pain, constipation, diarrhea, heartburn, melena and vomiting.  Genitourinary: Negative for dysuria, frequency and urgency.  Musculoskeletal: Positive for myalgias. Negative for back pain and neck pain.  Skin: Negative for rash.  Neurological: Negative for dizziness, tremors, sensory change, speech change, focal weakness and headaches.  Endo/Heme/Allergies: Does not bruise/bleed easily.  Psychiatric/Behavioral: Negative for depression.    MEDICATIONS AT HOME:   Prior to Admission medications   Medication Sig Start Date End Date Taking? Authorizing Provider  aspirin EC 81 MG EC tablet Take 1 tablet (81 mg total) by mouth daily. 08/31/14   Fritzi Mandes, MD      VITAL SIGNS:  Blood pressure (!) 181/88, pulse (!) 122, temperature 97.8 F (36.6 C), temperature source Oral, resp. rate 19, height 4\' 11"  (1.499 m), weight 60.8 kg (134 lb), SpO2 98 %.  PHYSICAL EXAMINATION:   Physical Exam  GENERAL:  82 y.o.-year-old elderly patient  lying in the bed with no acute distress.  EYES: Pupils equal, round, reactive to light and accommodation. No scleral icterus. Extraocular muscles intact.  HEENT: Head atraumatic, normocephalic. Oropharynx and nasopharynx clear.  NECK:  Supple, no jugular venous distention. No thyroid enlargement, no tenderness.  LUNGS: Normal breath sounds  bilaterally, no wheezing or crepitation. No use of accessory muscles of respiration. Bibasilar rales and rhonchi  CARDIOVASCULAR: S1, S2 normal. No rubs, or gallops. 2/6 systolic murmur present. ABDOMEN: Soft, nontender, nondistended. Bowel sounds present. No organomegaly or mass.  EXTREMITIES: No pedal edema, cyanosis, or clubbing.  NEUROLOGIC: Cranial nerves II through XII are intact except hearing loss and visual imapirment. Muscle strength 5/5 in all extremities. Sensation intact. Gait not checked.  PSYCHIATRIC: The patient is alert and oriented x 3.  SKIN: No obvious rash, lesion, or ulcer.   LABORATORY PANEL:   CBC  Recent Labs Lab 11/29/15 1404  WBC 7.3  HGB 12.5  HCT 37.2  PLT 219   ------------------------------------------------------------------------------------------------------------------  Chemistries   Recent Labs Lab 11/29/15 1404  NA 138  K 4.2  CL 104  CO2 25  GLUCOSE 122*  BUN 16  CREATININE 0.93  CALCIUM 8.8*  AST 28  ALT 12*  ALKPHOS 77  BILITOT 0.6   ------------------------------------------------------------------------------------------------------------------  Cardiac Enzymes  Recent Labs Lab 11/29/15 1404  TROPONINI <0.03   ------------------------------------------------------------------------------------------------------------------  RADIOLOGY:  Dg Chest 2 View  Result Date: 11/29/2015 CLINICAL DATA:  Shortness of breath and wheezing for the past 2 days, nonsmoker. EXAM: CHEST  2 VIEW COMPARISON:  Chest x-ray and chest CT scan of August 30, 2014 FINDINGS: The lungs are adequately inflated. The interstitial markings are mildly increased. The cardiac silhouette is enlarged but stable. The pulmonary vascularity is mildly prominent. There is tortuosity of the ascending and descending thoracic aorta. There is calcification in the wall of the aortic arch. There are chronic deformities of the proximal humeri. There are partial  compressions of lower thoracic vertebral bodies. IMPRESSION: Cardiomegaly with mild pulmonary vascular prominence consistent with low-grade compensated CHF. There is no alveolar pneumonia. There is thoracic aortic atherosclerosis. Electronically Signed   By: David  Martinique M.D.   On: 11/29/2015 15:04    EKG:   Orders placed or performed during the hospital encounter of 11/29/15  . ED EKG  . ED EKG  . EKG 12-Lead  . EKG 12-Lead    IMPRESSION AND PLAN:   Brandi Nelson  is a 82 y.o. female with a known history of breast cancer s/p mastectomy, macular degeneration and also hearing loss presents from home secondary to 2 day history of worsening shortness of breath.  #1 Acute bronchitis with reactive airway disease-Initially needed BiPAP, now weaned off BiPAP. -Still needing 3 L oxygen. Wean as tolerated. -Started on IV steroids. Also on duo nebs. -Started on azithromycin. Chest x-ray without any infiltrate.  #2 congestive heart failure-chest x-ray noted to have cardiomegaly and mild pulmonary edema. -We'll start on low sodium diet and start low-dose Lasix. -Echocardiogram ordered.  #3 macular degeneration-vision is impaired. So can get anxious easily.  #4 ET prophylaxis-on Lovenox  Physical Therapy consulted   All the records are reviewed and case discussed with ED provider. Management plans discussed with the patient, family and they are in agreement.  CODE STATUS: Full Code  TOTAL TIME TAKING CARE OF THIS PATIENT: 50 minutes.    Gladstone Lighter M.D on 11/29/2015 at 7:02 PM  Between 7am to 6pm - Pager - [phone]  After 6pm go to  [email or web address] - password [email or web address] [facility]  Office  [phone]  CC: Primary care physician; Madelyn Brunner, MD

## 2015-11-29 NOTE — ED Notes (Signed)
Admitting hospitalist at bedside. pts family present for consult.

## 2015-11-29 NOTE — ED Notes (Signed)
Patient transported to X-ray 

## 2015-11-29 NOTE — Progress Notes (Signed)
Enoxaparin has been reduced to 30 mg subcutaneously Q24H for CrCL less than 30 mL/min.  Jet Traynham A. [place], [place].D., BCPS Clinical Pharmacist 11/29/2015 2212

## 2015-11-30 ENCOUNTER — Inpatient Hospital Stay (HOSPITAL_COMMUNITY)
Admit: 2015-11-30 | Discharge: 2015-11-30 | Disposition: A | Discharge status: Home / Self Care | Payer: PPO | Attending: Internal Medicine | Admitting: Internal Medicine

## 2015-11-30 DIAGNOSIS — J45909 Unspecified asthma, uncomplicated: Secondary | ICD-10-CM | POA: Diagnosis not present

## 2015-11-30 DIAGNOSIS — I509 Heart failure, unspecified: Secondary | ICD-10-CM | POA: Diagnosis not present

## 2015-11-30 DIAGNOSIS — J209 Acute bronchitis, unspecified: Secondary | ICD-10-CM | POA: Diagnosis not present

## 2015-11-30 DIAGNOSIS — H3530 Unspecified macular degeneration: Secondary | ICD-10-CM | POA: Diagnosis not present

## 2015-11-30 LAB — PROCALCITONIN: PROCALCITONIN: 0.22 ng/mL

## 2015-11-30 LAB — ECHOCARDIOGRAM COMPLETE
Height: 59 in
WEIGHTICAEL: 2144 [oz_av]

## 2015-11-30 LAB — BASIC METABOLIC PANEL
ANION GAP: 12 (ref 5–15)
BUN: 24 mg/dL — AB (ref 6–20)
CHLORIDE: 102 mmol/L (ref 101–111)
CO2: 25 mmol/L (ref 22–32)
Calcium: 9.1 mg/dL (ref 8.9–10.3)
Creatinine, Ser: 1.12 mg/dL — ABNORMAL HIGH (ref 0.44–1.00)
GFR, EST AFRICAN AMERICAN: 44 mL/min — AB (ref >60)
GFR, EST NON AFRICAN AMERICAN: 38 mL/min — AB (ref >60)
Glucose, Bld: 107 mg/dL — ABNORMAL HIGH (ref 65–99)
POTASSIUM: 4 mmol/L (ref 3.5–5.1)
SODIUM: 139 mmol/L (ref 135–145)

## 2015-11-30 LAB — CBC
HEMATOCRIT: 37.1 % (ref 35.0–47.0)
HEMOGLOBIN: 12.7 g/dL (ref 12.0–16.0)
MCH: 29.9 pg (ref 26.0–34.0)
MCHC: 34.2 g/dL (ref 32.0–36.0)
MCV: 87.4 fL (ref 80.0–100.0)
PLATELETS: 215 10*3/uL (ref 150–440)
RBC: 4.24 MIL/uL (ref 3.80–5.20)
RDW: 14.9 % — ABNORMAL HIGH (ref 11.5–14.5)
WBC: 16.5 10*3/uL — AB (ref 3.6–11.0)

## 2015-11-30 MED ORDER — FLUTICASONE PROPIONATE 50 MCG/ACT NA SUSP
2.0000 | Freq: Every day | NASAL | Status: DC
  Administered 2015-11-30 – 2015-12-01 (×2): 2 via NASAL
  Filled 2015-11-30: qty 16

## 2015-11-30 MED ORDER — GUAIFENESIN ER 600 MG PO TB12
600.0000 mg | ORAL_TABLET | Freq: Two times a day (BID) | ORAL | Status: DC
  Administered 2015-11-30 – 2015-12-01 (×3): 600 mg via ORAL
  Filled 2015-11-30 (×3): qty 1

## 2015-11-30 MED ORDER — METHYLPREDNISOLONE SODIUM SUCC 125 MG IJ SOLR
60.0000 mg | Freq: Every day | INTRAMUSCULAR | Status: DC
  Administered 2015-11-30: 60 mg via INTRAVENOUS
  Filled 2015-11-30: qty 2

## 2015-11-30 MED ORDER — AZITHROMYCIN 500 MG IV SOLR
500.0000 mg | INTRAVENOUS | Status: DC
  Administered 2015-11-30: 18:00:00 500 mg via INTRAVENOUS
  Filled 2015-11-30 (×2): qty 500

## 2015-11-30 MED ORDER — IPRATROPIUM-ALBUTEROL 0.5-2.5 (3) MG/3ML IN SOLN
3.0000 mL | Freq: Three times a day (TID) | RESPIRATORY_TRACT | Status: DC
  Administered 2015-12-01: 3 mL via RESPIRATORY_TRACT
  Filled 2015-11-30: qty 3

## 2015-11-30 MED ORDER — ORAL CARE MOUTH RINSE
15.0000 mL | Freq: Two times a day (BID) | OROMUCOSAL | Status: DC
  Administered 2015-11-30 – 2015-12-01 (×3): 15 mL via OROMUCOSAL

## 2015-11-30 MED ORDER — METHYLPREDNISOLONE SODIUM SUCC 40 MG IJ SOLR
30.0000 mg | Freq: Every day | INTRAMUSCULAR | Status: DC
  Filled 2015-11-30: qty 1

## 2015-11-30 NOTE — Progress Notes (Signed)
[place] [facility]                                                                                                                                                                                  Patient Demographics   Brandi Nelson, is a 82 y.o. female, DOB - 1907/06/12, [id number]  Admit date - 11/29/2015   Admitting Physician Gladstone Lighter, MD  Outpatient Primary MD for the patient is Sarina Ser, Hewitt Blade, MD   LOS - 1  Subjective: Patient's breathing is improved. Denies any chest pain or palpitations. Has a cough.    Review of Systems:   CONSTITUTIONAL: No documented fever. No fatigue, weakness. No weight gain, no weight loss.  EYES: No blurry or double vision.  ENT: No tinnitus. No postnasal drip. No redness of the oropharynx.  RESPIRATORY: Positive cough, no wheeze, no hemoptysis. postive dyspnea.  CARDIOVASCULAR: No chest pain. No orthopnea. No palpitations. No syncope.  GASTROINTESTINAL: No nausea, no vomiting or diarrhea. No abdominal pain. No melena or hematochezia.  GENITOURINARY: No dysuria or hematuria.  ENDOCRINE: No polyuria or nocturia. No heat or cold intolerance.  HEMATOLOGY: No anemia. No bruising. No bleeding.  INTEGUMENTARY: No rashes. No lesions.  MUSCULOSKELETAL: No arthritis. No swelling. No gout.  NEUROLOGIC: No numbness, tingling, or ataxia. No seizure-type activity.  PSYCHIATRIC: No anxiety. No insomnia. No ADD.    Vitals:   Vitals:   11/29/15 2100 11/30/15 0809 11/30/15 1107 11/30/15 1329  BP: (!) 122/51 121/63  122/66  Pulse:  88  (!) 102  Resp:  20  20  Temp: 97.6 F (36.4 C) 97.5 F (36.4 C)  98.3 F (36.8 C)  TempSrc: Axillary Oral    SpO2:  95% 93% 92%  Weight:      Height:        Wt Readings from Last 3 Encounters:  11/29/15 134 lb (60.8 kg)  08/31/14 137 lb 11.2 oz (62.5 kg)     Intake/Output Summary (Last 24 hours) at 11/30/15 1400 Last data filed at 11/30/15 1353  Gross per 24 hour   Intake             1240 ml  Output                0 ml  Net             1240 ml    Physical Exam:   GENERAL: Pleasant-appearing in no apparent distress.  HEAD, EYES, EARS, NOSE AND THROAT: Atraumatic, normocephalic. Extraocular muscles are intact. Pupils equal and reactive to light. Sclerae anicteric. No conjunctival injection. No oro-pharyngeal erythema.  NECK: Supple. There is no jugular venous  distention. No bruits, no lymphadenopathy, no thyromegaly.  HEART: Regular rate and rhythm,. No murmurs, no rubs, no clicks.  LUNGS: Occasional wheezing No rales or rhonchi. No wheezes.  ABDOMEN: Soft, flat, nontender, nondistended. Has good bowel sounds. No hepatosplenomegaly appreciated.  EXTREMITIES: No evidence of any cyanosis, clubbing, or peripheral edema.  +2 pedal and radial pulses bilaterally.  NEUROLOGIC: The patient is alert, awake, and oriented x3 with no focal motor or sensory deficits appreciated bilaterally.  SKIN: Moist and warm with no rashes appreciated.  Psych: Not anxious, depressed LN: No inguinal LN enlargement    Antibiotics   Anti-infectives    Start     Dose/Rate Route Frequency Ordered Stop   11/30/15 1800  azithromycin (ZITHROMAX) 500 mg in dextrose 5 % 250 mL IVPB     500 mg 250 mL/hr over 60 Minutes Intravenous Every 24 hours 11/30/15 0901 12/04/15 1759   11/30/15 1000  azithromycin (ZITHROMAX) 500 mg in dextrose 5 % 250 mL IVPB  Status:  Discontinued     500 mg 250 mL/hr over 60 Minutes Intravenous Every 24 hours 11/29/15 2210 11/30/15 0901   11/29/15 1630  azithromycin (ZITHROMAX) 500 mg in dextrose 5 % 250 mL IVPB     500 mg 250 mL/hr over 60 Minutes Intravenous  Once 11/29/15 1620 11/29/15 1817      Medications   Scheduled Meds: . amLODipine  2.5 mg Oral Daily  . aspirin EC  81 mg Oral Daily  . azithromycin  500 mg Intravenous Q24H  . enoxaparin (LOVENOX) injection  30 mg Subcutaneous QHS  . fluticasone  2 spray Each Nare Daily  . guaiFENesin   600 mg Oral BID  . ipratropium-albuterol  3 mL Nebulization Q4H  . mouth rinse  15 mL Mouth Rinse BID  . methylPREDNISolone (SOLU-MEDROL) injection  60 mg Intravenous Daily  . sodium chloride flush  3 mL Intravenous Q12H   Continuous Infusions: PRN Meds:.acetaminophen **OR** acetaminophen, hydrALAZINE, ondansetron **OR** ondansetron (ZOFRAN) IV   Data Review:   Micro Results No results found for this or any previous visit (from the past 240 hour(s)).  Radiology Reports Dg Chest 2 View  Result Date: 11/29/2015 CLINICAL DATA:  Shortness of breath and wheezing for the past 2 days, nonsmoker. EXAM: CHEST  2 VIEW COMPARISON:  Chest x-ray and chest CT scan of August 30, 2014 FINDINGS: The lungs are adequately inflated. The interstitial markings are mildly increased. The cardiac silhouette is enlarged but stable. The pulmonary vascularity is mildly prominent. There is tortuosity of the ascending and descending thoracic aorta. There is calcification in the wall of the aortic arch. There are chronic deformities of the proximal humeri. There are partial compressions of lower thoracic vertebral bodies. IMPRESSION: Cardiomegaly with mild pulmonary vascular prominence consistent with low-grade compensated CHF. There is no alveolar pneumonia. There is thoracic aortic atherosclerosis. Electronically Signed   By: David  Martinique M.D.   On: 11/29/2015 15:04     CBC  Recent Labs Lab 11/29/15 1404 11/30/15 0959  WBC 7.3 16.5*  HGB 12.5 12.7  HCT 37.2 37.1  PLT 219 215  MCV 88.6 87.4  MCH 29.9 29.9  MCHC 33.7 34.2  RDW 14.8* 14.9*    Chemistries   Recent Labs Lab 11/29/15 1404 11/30/15 0959  NA 138 139  K 4.2 4.0  CL 104 102  CO2 25 25  GLUCOSE 122* 107*  BUN 16 24*  CREATININE 0.93 1.12*  CALCIUM 8.8* 9.1  AST 28  --   ALT  12*  --   ALKPHOS 77  --   BILITOT 0.6  --     ------------------------------------------------------------------------------------------------------------------ estimated creatinine clearance is 18.5 mL/min (by C-G formula based on SCr of 1.12 mg/dL (H)). ------------------------------------------------------------------------------------------------------------------ No results for input(s): HGBA1C in the last 72 hours. ------------------------------------------------------------------------------------------------------------------ No results for input(s): CHOL, HDL, LDLCALC, TRIG, CHOLHDL, LDLDIRECT in the last 72 hours. ------------------------------------------------------------------------------------------------------------------ No results for input(s): TSH, T4TOTAL, T3FREE, THYROIDAB in the last 72 hours.  Invalid input(s): FREET3 ------------------------------------------------------------------------------------------------------------------ No results for input(s): VITAMINB12, FOLATE, FERRITIN, TIBC, IRON, RETICCTPCT in the last 72 hours.  Coagulation profile No results for input(s): INR, PROTIME in the last 168 hours.  No results for input(s): DDIMER in the last 72 hours.  Cardiac Enzymes  Recent Labs Lab 11/29/15 1404  TROPONINI <0.03   ------------------------------------------------------------------------------------------------------------------ Invalid input(s): POCBNP    Assessment & Plan   Brandi Nelson  is a 82 y.o. female with a known history of breast cancer s/p mastectomy, macular degeneration and also hearing loss presents from home secondary to 2 day history of worsening shortness of breath.  #1 Acute bronchitis with reactive airway diseas -We'll wean oxygen as tolerated -Continue therapy with IV steroids and nebs -Continue azithromycin. No evidence of pneumonia  #2 possible congestive heart failure-chest x-ray noted to have cardiomegaly and mild pulmonary edema. Patient is not urinating  much I will stop the Lasix await echocardiogram of the heart   #3 macular degeneration-vision is impaired. Continue therapy as at home #4 DVT prophylaxis-on Lovenox      Code Status Orders        Start     Ordered   11/29/15 2211  Full code  Continuous     11/29/15 2210    Code Status History    Date Active Date Inactive Code Status Order ID Comments User Context   08/30/2014  6:14 PM 08/31/2014  8:11 PM DNR [id number]  Idelle Crouch, MD Inpatient    Advance Directive Documentation   Flowsheet Row Most Recent Value  Type of Advance Directive  Healthcare Power of Attorney, Living will  Pre-existing out of facility DNR order (yellow form or pink MOST form)  No data  "MOST" Form in Place?  No data           ConsultsNone  DVT Prophylaxis  Lovenox   Lab Results  Component Value Date   PLT 215 11/30/2015     Time Spent in minutes   11min  Greater than 50% of time spent in care coordination and counseling patient regarding the condition and plan of care.   Dustin Flock M.D on 11/30/2015 at 2:00 PM  Between 7am to 6pm - Pager - [phone]  After 6pm go to [email or web address] - password [email or web address] [facility]   Office  [phone]

## 2015-11-30 NOTE — Progress Notes (Signed)
While rounding, [place] made initial visit to room 101. Pt was sitting in chair beside bed. Son was bedside.  Pt is a spirited 82 yr old female. Despite being hard of hearing and diminished sight, had a great conversation and found some points of commonality. (Pt and CH both attended the same college. 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 There was no indication of a need for spiritual care at this time. CH is available for follow up as needed.    11/30/15 1300  Clinical Encounter Type  Visited With Patient;Patient and family together  Visit Type Initial;Spiritual support  Referral From Nurse  Spiritual Encounters  Spiritual Needs (Patient did not need spiritual care at this time.)

## 2015-11-30 NOTE — Care Management Important Message (Signed)
Important Message  Patient Details  Name: Brandi Nelson MRN: [id number] Date of Birth: 10-30-1907   Medicare Important Message Given:  Yes    Shelbie Ammons, RN 11/30/2015, 8:14 AM

## 2015-11-30 NOTE — Plan of Care (Signed)
Problem: Education: Goal: Knowledge of  General Education information/materials will improve Outcome: Progressing With family  Problem: Safety: Goal: Ability to remain free from injury will improve Outcome: Progressing Chair all day

## 2015-11-30 NOTE — Care Management (Addendum)
Admitted to this facility with the diagnosis of respiratory failure. Lives alone. Home maker in the past and active in garden clubs. Family members in the home from 7:00am-12 noon, caregivers 4:00 pm-8:00 pm. Son is Brandi Nelson [phone] or [phone]), Last seen Dr. Lisette Grinder 4 weeks ago. Fell 6 weeks ago. Fractured skull and nose. [facility] following last admission. (not coming to the home now). No skilled facility. No home oxygen. No Life Alert. Rolling Walker in the home, if needed. She want use the walker per granddaughter, Brandi Nelson. Good appetite, but not so good since being sick per granddaughter. Prescriptions are filled at [facility] in [place]. Hard of hearing. Family will transport. Shelbie Ammons RN MSN CCM Care Management

## 2015-11-30 NOTE — Progress Notes (Signed)
*  PRELIMINARY RESULTS* Echocardiogram 2D Echocardiogram has been performed.  Brandi Nelson 11/30/2015, 12:07 PM

## 2015-11-30 NOTE — Progress Notes (Signed)
PT Evaluation Note  Assessment: Pt admitted with above diagnosis. Pt currently with functional limitations due to the deficits listed below (see PT Problem List). Brandi Nelson presents with generalized weakness and cardiopulmonary impairments. SpO2 as low as 89% on RA upon PT arrival.  O2 reapplied at 2L via [place] with SpO2 remaining at or above 92% on 2L for the remainder of the session. She requires min assist with transfers and with ambulating in hallway due to instability.  Per granddaughter pt will have 24/7 supervision/assist at d/c.  Pt will benefit from skilled PT to increase their independence and safety with mobility to allow discharge to the venue listed below.       11/30/15 0849  PT Visit Information  Last PT Received On 11/30/15  Assistance Needed +1  History of Present Illness Pt is a 82 y/o F who presents from home with c/o worsening SOB, she has been exposed to her son who has bronchitis for the last month.  Admitting dx: acute bronchitis with reactive airway disease.  Initially pt need BiPAP but has been weaned off.  Pt noted to have CHF. Of note, pt went to the ED on 10/22/15 s/p fall with laceration to R forehead, CT of the head and cervical spine revealed left occipital condyle fracture and body of C1 fracture which I discussed both of these fractures with the on-call neurosurgeon at [facility] who recommended discharge home, normal activity.  Pt's PMH includes breast cancer, hearing loss, macular degeneration.  Precautions  Precautions Fall;Other (comment)  Precaution Comments monitor O2  Restrictions  Weight Bearing Restrictions No  Home Living  Family/patient expects to be discharged to: Private residence  Living Arrangements Alone  Available Help at Discharge Family;Available 24 hours/day (Granddaughter reports family can provide 24/7 if needed)  Type of [place] to enter  Entrance Stairs-Number of Steps 2  Entrance Stairs-Rails Left  Home Layout One  level  Bathroom Shower/Tub Walk-in Cytogeneticist Yes  [place] - 2 wheels;Shower seat  Additional Comments 3 familiy members visit the pt each day and spend a few hours each with her.  Has a personal care attendant who comes from 4pm-8pm who assists with dressing.    Prior Function  Level of Independence Needs assistance  Gait / Transfers Assistance Needed Refuses to use AD.    ADL's / Homemaking Assistance Needed Pt does not remember to take baths and the family assists her with this.  She can dress independently if her clothes are laid out.  Family does the cooking or takes her out to eat.    Communication  Communication HOH;Other (comment) (doesn't have hearing aides, charging at home)  Pain Assessment  Pain Assessment No/denies pain  Cognition  Arousal/Alertness Awake/alert  Behavior During Therapy WFL for tasks assessed/performed  Overall Cognitive Status History of cognitive impairments - at baseline  Upper Extremity Assessment  Upper Extremity Assessment Generalized weakness  Lower Extremity Assessment  Lower Extremity Assessment Generalized weakness  Cervical / Trunk Assessment  Cervical / Trunk Assessment Kyphotic  Bed Mobility  General bed mobility comments Pt sitting in recliner chair upon PT arrival  Transfers  Overall transfer level Needs assistance  Equipment used 1 person hand held assist  Transfers Sit to/from Stand  Sit to Stand Min assist  General transfer comment 1 person HHA to steady during sit<>stand, pt slow to stand.  Sit<>stand x3 from chair, x1 from commode.  Ambulation/Gait  Ambulation/Gait  assistance Min assist  Ambulation Distance (Feet) 200 Feet  Assistive device None  Gait Pattern/deviations Step-through pattern;Decreased stride length;Trunk flexed  General Gait Details Slow gait speed with moderate unsteadiness.  No LOB but minA to steady at times.  Pt reports fatigue after ambulating 200 ft.   Gait velocity decreased  Gait velocity interpretation Below normal speed for age/gender  Balance  Overall balance assessment Needs assistance;History of Falls  Sitting-balance support No upper extremity supported;Feet supported  Sitting balance-Leahy Scale Good  Standing balance support No upper extremity supported;During functional activity  Standing balance-Leahy Scale Poor  Standing balance comment Up to minA needed for dynamic activities  General Comments  General comments (skin integrity, edema, etc.) SpO2 as low as 89% on RA upon PT arrival.  O2 reapplied at 2L via [place] with SpO2 remaining at or above 92% on 2L for the remainder of the session.  PT - End of Session  Equipment Utilized During Treatment Gait belt;Oxygen  Activity Tolerance Patient limited by fatigue  Patient left in chair;with call bell/phone within reach;with family/visitor present;Other (comment) (family to notify nursing staff if she is to leave the room)  Nurse Communication Mobility status;Other (comment) (SpO2)  PT Assessment  PT Recommendation/Assessment Patient needs continued PT services  PT Problem List Decreased strength;Decreased activity tolerance;Decreased balance;Decreased cognition;Decreased knowledge of use of DME;Decreased safety awareness;Cardiopulmonary status limiting activity  PT Plan  PT Frequency (ACUTE ONLY) Min 2X/week  PT Treatment/Interventions (ACUTE ONLY) DME instruction;Gait training;Stair training;Functional mobility training;Therapeutic activities;Therapeutic exercise;Balance training;Neuromuscular re-education;Patient/family education;Cognitive remediation  PT Recommendation  Follow Up Recommendations Home health PT;Supervision/Assistance - 24 hour (strict 24/7 supervision/assist)  PT equipment None recommended by PT  Individuals Consulted  Consulted and Agree with Results and Recommendations Patient;Family member/caregiver  Family Member Consulted Granddaughter, Patty  Acute Rehab  PT Goals  Patient Stated Goal to go home  PT Goal Formulation With patient/family  Time For Goal Achievement 12/14/15  Potential to Achieve Goals Good  PT Time Calculation  PT Start Time (ACUTE ONLY) 0839  PT Stop Time (ACUTE ONLY) 0912  PT Time Calculation (min) (ACUTE ONLY) 33 min  PT General Charges  $$ ACUTE PT VISIT 1 Procedure  PT Evaluation  $PT Eval Low Complexity 1 Procedure  PT Treatments  $Therapeutic Activity 8-22 mins  Written Expression  Dominant Hand Right    Brandi Nelson PT, DPT

## 2015-12-01 DIAGNOSIS — H3530 Unspecified macular degeneration: Secondary | ICD-10-CM | POA: Diagnosis not present

## 2015-12-01 DIAGNOSIS — I509 Heart failure, unspecified: Secondary | ICD-10-CM | POA: Diagnosis not present

## 2015-12-01 DIAGNOSIS — J209 Acute bronchitis, unspecified: Secondary | ICD-10-CM | POA: Diagnosis not present

## 2015-12-01 DIAGNOSIS — J45909 Unspecified asthma, uncomplicated: Secondary | ICD-10-CM | POA: Diagnosis not present

## 2015-12-01 MED ORDER — IPRATROPIUM-ALBUTEROL 20-100 MCG/ACT IN AERS: 1.0000 | INHALATION_SPRAY | Freq: Four times a day (QID) | RESPIRATORY_TRACT | 0 refills | Status: DC

## 2015-12-01 MED ORDER — PREDNISONE 10 MG (21) PO TBPK: 10.0000 mg | ORAL_TABLET | Freq: Every day | ORAL | 0 refills | Status: DC

## 2015-12-01 MED ORDER — AZITHROMYCIN 500 MG PO TABS: 500.0000 mg | ORAL_TABLET | Freq: Every day | ORAL | 0 refills | Status: AC

## 2015-12-01 MED ORDER — GUAIFENESIN-DM 100-10 MG/5ML PO SYRP: 5.0000 mL | ORAL_SOLUTION | ORAL | 0 refills | Status: DC | PRN

## 2015-12-01 MED ORDER — GUAIFENESIN ER 600 MG PO TB12: 600.0000 mg | ORAL_TABLET | Freq: Two times a day (BID) | ORAL | 0 refills | Status: DC

## 2015-12-01 NOTE — Care Management (Signed)
Physical therapy evaluation completed. Recommends home with home health and physical therapy. Discussed agencies with son, [name], and daughter at the bedside. [place]. Manuela Schwartz from [facility] updated. Referral will need to be called to son's home to arrange home visit. [phone] or [phone]).  Discharge to home today per Dr. Posey Pronto. Shelbie Ammons RN MSN CCM Care Management

## 2015-12-01 NOTE — Progress Notes (Signed)
Pt is being discharged home with [facility]. Discharge papers given and explained to pt and pt's daughter. Pt's daughter verbalized understanding. Meds and f/u appointment reviewed with pt's daughter. Awaiting a wheelchair to be escorted.

## 2015-12-01 NOTE — Discharge Summary (Signed)
[facility] [place] at [facility], [place] y.o., DOB [birth year], MRN [id number]. Admission date: 11/29/2015 Discharge Date 12/01/2015 Primary MD Madelyn Brunner, MD Admitting Physician Gladstone Lighter, MD  Admission Diagnosis  Acute pulmonary edema ([place]) [J81.0] SOB (shortness of breath) [R06.02] Bronchitis [J40]  Discharge Diagnosis   Active Problems:   Acute hypoxic respiratory failure due to acute bronchitis with bronchospasm CHF ruled out   Macular degeneration   hearling loss           [place]  is a 82 y.o. female with a known history of breast cancer s/p mastectomy, macular degeneration and also hearing loss presents from home secondary to 2 day history of worsening shortness of breath. Pt was seen in ed noted to have hypoxia and had a chest x-ray which showed possible congestive heart failure. She was given Lasix and treated with anabiotic's nebulizers and steroids. Patient appeared to be dry with IV Lasix was discontinued and her echo showed grade 1 diastolic CHF she did not appear to have CHF. Her symptoms were consistent with acute bronchitis with bronchospasm. She is doing much better and is off oxygen therapy.              Consults  None  Significant Tests:  See full reports for all details     Dg Chest 2 View  Result Date: 11/29/2015 CLINICAL DATA:  Shortness of breath and wheezing for the past 2 days, nonsmoker. EXAM: CHEST  2 VIEW COMPARISON:  Chest x-ray and chest CT scan of August 30, 2014 FINDINGS: The lungs are adequately inflated. The interstitial markings are mildly increased. The cardiac silhouette is enlarged but stable. The pulmonary vascularity is mildly prominent. There is tortuosity of the ascending and descending thoracic aorta. There is calcification in the wall of the aortic arch. There are chronic deformities of the proximal humeri. There are partial compressions of lower thoracic  vertebral bodies. IMPRESSION: Cardiomegaly with mild pulmonary vascular prominence consistent with low-grade compensated CHF. There is no alveolar pneumonia. There is thoracic aortic atherosclerosis. Electronically Signed   By: David  Martinique M.D.   On: 11/29/2015 15:04       Today   Subjective:   Laverta [place]  feeling better shortness of breath improved still has some cough   Objective:   Blood pressure 133/73, pulse 96, temperature 98 F (36.7 C), temperature source Oral, resp. rate 18, height 4\' 11"  (1.499 m), weight 134 lb (60.8 kg), SpO2 96 %.  .  Intake/Output Summary (Last 24 hours) at 12/01/15 1538 Last data filed at 12/01/15 0800  Gross per 24 hour  Intake              730 ml  Output                0 ml  Net              730 ml    Exam VITAL SIGNS: Blood pressure 133/73, pulse 96, temperature 98 F (36.7 C), temperature source Oral, resp. rate 18, height 4\' 11"  (1.499 m), weight 134 lb (60.8 kg), SpO2 96 %.  GENERAL:  82 y.o.-year-old patient lying in the bed with no acute distress.  EYES: Pupils equal, round, reactive to light and accommodation. No scleral icterus. Extraocular muscles intact.  HEENT: Head atraumatic, normocephalic. Oropharynx and nasopharynx clear.  NECK:  Supple, no jugular venous distention. No thyroid enlargement, no tenderness.  LUNGS: Normal breath sounds bilaterally,  no wheezing, rales,rhonchi or crepitation. No use of accessory muscles of respiration.  CARDIOVASCULAR: S1, S2 normal. No murmurs, rubs, or gallops.  ABDOMEN: Soft, nontender, nondistended. Bowel sounds present. No organomegaly or mass.  EXTREMITIES: No pedal edema, cyanosis, or clubbing.  NEUROLOGIC: Cranial nerves II through XII are intact. Muscle strength 5/5 in all extremities. Sensation intact. Gait not checked.  PSYCHIATRIC: The patient is alert and oriented x 3.  SKIN: No obvious rash, lesion, or ulcer.   Data Review     CBC w Diff: Lab Results  Component Value Date    WBC 16.5 (H) 11/30/2015   HGB 12.7 11/30/2015   HGB 13.4 10/26/2012   HCT 37.1 11/30/2015   HCT 39.6 (L) 10/26/2012   PLT 215 11/30/2015   PLT 218 10/26/2012   CMP: Lab Results  Component Value Date   NA 139 11/30/2015   NA 136 10/26/2012   K 4.0 11/30/2015   K 3.9 10/26/2012   CL 102 11/30/2015   CL 102 10/26/2012   CO2 25 11/30/2015   CO2 27 10/26/2012   BUN 24 (H) 11/30/2015   BUN 14 10/26/2012   CREATININE 1.12 (H) 11/30/2015   CREATININE 0.85 10/26/2012   PROT 7.3 11/29/2015   PROT 7.3 10/26/2012   ALBUMIN 4.0 11/29/2015   ALBUMIN 4.0 10/26/2012   BILITOT 0.6 11/29/2015   BILITOT 0.8 10/26/2012   ALKPHOS 77 11/29/2015   ALKPHOS 194 (H) 10/26/2012   AST 28 11/29/2015   AST 152 (H) 10/26/2012   ALT 12 (L) 11/29/2015   ALT 59 10/26/2012  .  Micro Results No results found for this or any previous visit (from the past 240 hour(s)).      Code Status Orders        Start     Ordered   11/29/15 2211  Full code  Continuous     11/29/15 2210    Code Status History    Date Active Date Inactive Code Status Order ID Comments User Context   08/30/2014  6:14 PM 08/31/2014  8:11 PM DNR [id number]  Idelle Crouch, MD Inpatient    Advance Directive Documentation   Flowsheet Row Most Recent Value  Type of Advance Directive  Healthcare Power of Attorney, Living will  Pre-existing out of facility DNR order (yellow form or pink MOST form)  No data  "MOST" Form in Place?  No data          Follow-up Information    Sarina Ser, JOHN B, MD Follow up in 7 day(s).   Specialty:  Internal Medicine Contact information: [place] [address] [phone]           Discharge Medications     Medication List    TAKE these medications   aspirin 81 MG EC tablet Take 1 tablet (81 mg total) by mouth daily.   azithromycin 500 MG tablet Commonly known as:  ZITHROMAX Take 1 tablet (500 mg total) by mouth daily.    guaiFENesin 600 MG 12 hr tablet Commonly known as:  MUCINEX Take 1 tablet (600 mg total) by mouth 2 (two) times daily.   guaiFENesin-dextromethorphan 100-10 MG/5ML syrup Commonly known as:  ROBITUSSIN DM Take 5 mLs by mouth every 4 (four) hours as needed for cough.   Ipratropium-Albuterol 20-100 MCG/ACT Aers respimat Commonly known as:  COMBIVENT RESPIMAT Inhale 1 puff into the lungs every 6 (six) hours.   predniSONE 10 MG (21) Tbpk tablet Commonly known as:  STERAPRED UNI-PAK 21 TAB Take 1 tablet (10 mg total) by mouth daily. Start at 60mg  taper by 10mg  until complete          Total Time in preparing paper work, data evaluation and todays exam - 35 minutes  Dustin Flock M.D on 12/01/2015 at 3:38 PM  [facility]   Office  [phone]

## 2015-12-01 NOTE — Discharge Instructions (Signed)
[facility] - [place] at [facility] ° °DIET:  °Cardiac diet ° °DISCHARGE CONDITION:  °Stable ° °ACTIVITY:  °Activity as tolerated ° °OXYGEN:  °Home Oxygen: No. °  °Oxygen Delivery: room air ° °DISCHARGE LOCATION:  °home  ° ° °ADDITIONAL DISCHARGE INSTRUCTION: ° ° °If you experience worsening of your admission symptoms, develop shortness of breath, life threatening emergency, suicidal or homicidal thoughts you must seek medical attention immediately by calling 911 or calling your MD immediately  if symptoms less severe. ° °You Must read complete instructions/literature along with all the possible adverse reactions/side effects for all the Medicines you take and that have been prescribed to you. Take any new Medicines after you have completely understood and accpet all the possible adverse reactions/side effects.  ° °Please note ° °You were cared for by a hospitalist during your hospital stay. If you have any questions about your discharge medications or the care you received while you were in the hospital after you are discharged, you can call the unit and asked to speak with the hospitalist on call if the hospitalist that took care of you is not available. Once you are discharged, your primary care physician will handle any further medical issues. Please note that NO REFILLS for any discharge medications will be authorized once you are discharged, as it is imperative that you return to your primary care physician (or establish a relationship with a primary care physician if you do not have one) for your aftercare needs so that they can reassess your need for medications and monitor your lab values. ° ° °

## 2015-12-10 NOTE — Progress Notes (Signed)
[facility]  Patient Status: Not taken under care, patient refused home health visit due to copay. [place], CM.   Brandi Nelson 12/10/2015, 8:08 AM

## 2016-03-07 DIAGNOSIS — H903 Sensorineural hearing loss, bilateral: Secondary | ICD-10-CM | POA: Diagnosis not present

## 2016-03-07 DIAGNOSIS — H6123 Impacted cerumen, bilateral: Secondary | ICD-10-CM | POA: Diagnosis not present

## 2016-05-03 ENCOUNTER — Emergency Department
Admission: EM | Admit: 2016-05-03 | Discharge: 2016-05-03 | Disposition: A | Discharge status: Home / Self Care | Payer: PPO | Attending: Emergency Medicine | Admitting: Emergency Medicine

## 2016-05-03 ENCOUNTER — Encounter: Payer: Self-pay | Admitting: Emergency Medicine

## 2016-05-03 ENCOUNTER — Emergency Department: Payer: PPO

## 2016-05-03 DIAGNOSIS — R079 Chest pain, unspecified: Secondary | ICD-10-CM | POA: Diagnosis not present

## 2016-05-03 DIAGNOSIS — Z853 Personal history of malignant neoplasm of breast: Secondary | ICD-10-CM | POA: Diagnosis not present

## 2016-05-03 DIAGNOSIS — J441 Chronic obstructive pulmonary disease with (acute) exacerbation: Secondary | ICD-10-CM | POA: Insufficient documentation

## 2016-05-03 DIAGNOSIS — R0602 Shortness of breath: Secondary | ICD-10-CM | POA: Diagnosis not present

## 2016-05-03 DIAGNOSIS — Z79899 Other long term (current) drug therapy: Secondary | ICD-10-CM | POA: Insufficient documentation

## 2016-05-03 DIAGNOSIS — Z7982 Long term (current) use of aspirin: Secondary | ICD-10-CM | POA: Diagnosis not present

## 2016-05-03 LAB — CBC
HCT: 37.2 % (ref 35.0–47.0)
Hemoglobin: 12.6 g/dL (ref 12.0–16.0)
MCH: 30.7 pg (ref 26.0–34.0)
MCHC: 34 g/dL (ref 32.0–36.0)
MCV: 90.3 fL (ref 80.0–100.0)
Platelets: 177 10*3/uL (ref 150–440)
RBC: 4.12 MIL/uL (ref 3.80–5.20)
RDW: 15.5 % — ABNORMAL HIGH (ref 11.5–14.5)
WBC: 11.5 10*3/uL — AB (ref 3.6–11.0)

## 2016-05-03 LAB — BASIC METABOLIC PANEL
ANION GAP: 9 (ref 5–15)
BUN: 18 mg/dL (ref 6–20)
CO2: 27 mmol/L (ref 22–32)
Calcium: 9.4 mg/dL (ref 8.9–10.3)
Chloride: 102 mmol/L (ref 101–111)
Creatinine, Ser: 0.85 mg/dL (ref 0.44–1.00)
GFR calc Af Amer: >60 mL/min (ref >60)
GFR, EST NON AFRICAN AMERICAN: 52 mL/min — AB (ref >60)
Glucose, Bld: 131 mg/dL — ABNORMAL HIGH (ref 65–99)
POTASSIUM: 4.4 mmol/L (ref 3.5–5.1)
SODIUM: 138 mmol/L (ref 135–145)

## 2016-05-03 LAB — TROPONIN I

## 2016-05-03 NOTE — ED Triage Notes (Signed)
Pt presents to ED via [facility] with c/o increasing SHOB since yesterday. Pt denies any complaints at this time, pt is noted to be very HOH at this time.

## 2016-05-03 NOTE — ED Notes (Signed)
NAD noted at time of D/C. Pt taken to the lobby via wheelchair by her family. Pt and family denies comments/concerns at this time.

## 2016-05-03 NOTE — Discharge Instructions (Signed)
Please make an appointment to follow up with your primary care doctor in the next 1-2 days. Please return to the emergency department if you develop severe pain, shortness of breath, fever, lightheadedness or fainting, or any other symptoms concerning to you.

## 2016-05-03 NOTE — ED Provider Notes (Signed)
[facility] Emergency Department Provider Note  ____________________________________________  Time seen: Approximately 7:24 PM  I have reviewed the triage vital signs and the nursing notes.   HISTORY  Chief Complaint Shortness of Breath    HPI Brandi Nelson is a 83 y.o. female with a remote history of breast cancer, COPD, brought by EMS for shortness of breath. On arrival to the emergency department, the patient denies that she is having any shortness of breath. EMS states that she told her son that she wanted to be brought to the hospital for shortness of breath.In route, the patient had stable vital signs and continued to mentate at baseline. The patient denies any cough or cold symptoms, chest pain, lightheadedness or fainting.   Past Medical History:  Diagnosis Date  . Cancer (HCC)    breast  . Hearing loss   . Macular degeneration     Patient Active Problem List   Diagnosis Date Noted  . Respiratory failure ([place]) 11/29/2015  . COPD exacerbation ([employer]) 11/29/2015  . Chest pain 08/30/2014  . Elevated troponin 08/30/2014    Past Surgical History:  Procedure Laterality Date  . MASTECTOMY      Current Outpatient Rx  . Order #: [id number] Class: Normal  . Order #: [id number] Class: Normal  . Order #: [id number] Class: Normal  . Order #: [id number] Class: Normal  . Order #: [id number] Class: Normal    Allergies Penicillin g  Family History  Problem Relation Age of Onset  . Family history unknown: Yes    Social History Social History  Substance Use Topics  . Smoking status: Never Smoker  . Smokeless tobacco: Never Used  . Alcohol use No    Review of Systems Constitutional: No fever/chills.No lightheadedness or syncope. Eyes: No visual changes. No eye discharge. ENT: No sore throat. No congestion or rhinorrhea. Hard of hearing bilaterally but no ear pain. Cardiovascular: Denies chest pain. Denies palpitations. Respiratory: Denies  shortness of breath at this time, but didn't call the paramedics for this..  No cough. Gastrointestinal: No abdominal pain.  No nausea, no vomiting.  No diarrhea.  No constipation. Genitourinary: Negative for dysuria. Musculoskeletal: Negative for back pain. Skin: Negative for rash. Neurological: Negative for headaches. No focal numbness, tingling or weakness.   10-point ROS otherwise negative.  ____________________________________________   PHYSICAL EXAM:  VITAL SIGNS: ED Triage Vitals  Enc Vitals Group     BP      Pulse      Resp      Temp      Temp src      SpO2      Weight      Height      Head Circumference      Peak Flow      Pain Score      Pain Loc      Pain Edu?      Excl. in [place]?     Constitutional: Alert and answers questions appropriately. She does not know the year or the month, but she now she is at the hospital. She is comfortable appearing and nontoxic. Eyes: Conjunctivae are normal.  EOMI. No scleral icterus. No eye discharge. Head: Atraumatic. Nose: No congestion/rhinnorhea. Mouth/Throat: Mucous membranes are moist.  Neck: No stridor.  Supple.  No meningismus. Cardiovascular: Normal rate, regular rhythm. No murmurs, rubs or gallops. Right-sided mastectomy scar. Respiratory: Normal respiratory effort.  No accessory muscle use or retractions. Lungs CTAB.  No wheezes, rales or ronchi. Gastrointestinal: Soft, nontender and nondistended.  No guarding or rebound.  No peritoneal signs. Musculoskeletal: No LE edema. No ttp in the calves or palpable cords.  Negative Homan's sign. Neurologic:  Alert.  Speech is clear.  Face and smile are symmetric.  EOMI.  Moves all extremities well. Skin:  Skin is warm, dry and intact. No rash noted. Psychiatric: Mood and affect are normal. Speech and behavior are normal.  Normal judgement.  ____________________________________________   LABS (all labs ordered are listed, but only abnormal results are displayed)  Labs  Reviewed  CBC - Abnormal; Notable for the following:       Result Value   WBC 11.5 (*)    RDW 15.5 (*)    All other components within normal limits  BASIC METABOLIC PANEL - Abnormal; Notable for the following:    Glucose, Bld 131 (*)    GFR calc non Af Amer 52 (*)    All other components within normal limits  TROPONIN I   ____________________________________________  EKG  ED ECG REPORT I, Eula Listen, the attending physician, personally viewed and interpreted this ECG.   Date: 05/03/2016  EKG Time: 1935  Rate: 81  Rhythm: normal sinus rhythm  Axis: leftward  Intervals:none  ST&T Change: No STEMI The report states this is aflutter, but it is a poor baseline tracing, but there are multiple leads where I can see standard pwaves c/w NSR.  ____________________________________________  RADIOLOGY  Dg Chest 2 View  Result Date: 05/03/2016 CLINICAL DATA:  Shortness of Breath EXAM: CHEST  2 VIEW COMPARISON:  11/29/2015 FINDINGS: Cardiac shadow remains enlarged. Aortic calcifications are again noted. Lungs are well aerated bilaterally with mild interstitial changes stable from the prior study. No acute infiltrate or sizable effusion is noted. Chronic changes in the shoulder joints are noted bilaterally. Degenerative changes of the thoracic spine are noted. IMPRESSION: No active cardiopulmonary disease. Electronically Signed   By: Inez Catalina M.D.   On: 05/03/2016 19:54    ____________________________________________   PROCEDURES  Procedure(s) performed: None  Procedures  Critical Care performed: No ____________________________________________   INITIAL IMPRESSION / ASSESSMENT AND PLAN / ED COURSE  Pertinent labs & imaging results that were available during my care of the patient were reviewed by me and considered in my medical decision making (see chart for details).  83 y.o. female with a history of COPD presenting for shortness of breath, although at this  time she is not having any shortness of breath or hypoxia. On my examination, the patient has a normal respiratory rate without any abnormalities on her cardiopulmonary examination. We will start with a screening EKG and a chest x-ray, and I will speak to the family about further workup including blood work. There is no recent history of infection, so pneumonia is unlikely. There are other potential causes of shortness of breath including anemia, but I want to speak with the family first about their goals of care for this patient.  ----------------------------------------- 7:51 PM on 05/03/2016 -----------------------------------------  I've spoken with the patient's 2 sons, who would like to proceed with basic laboratory studies including ruling out electrolyte disturbance, anemia, or are grossly elevated troponin. At this time, the patient continues to be hemodynamically stable and comfortable without any symptoms.  ----------------------------------------- 8:44 PM on 05/03/2016 -----------------------------------------  The patient's blood counts are reassuring, her electrolytes are within normal limits and her troponin is negative. Her chest x-ray does not show any acute cardiopulmonary process. Her EKG does not show ischemic changes. At this time, the patient is safe  for discharge. We'll have her follow-up with her primary care physician and I did discussed return precautions with the patient and her family.  ____________________________________________  FINAL CLINICAL IMPRESSION(S) / ED DIAGNOSES  Final diagnoses:  Chest pain, unspecified type  Shortness of breath         NEW MEDICATIONS STARTED DURING THIS VISIT:  New Prescriptions   No medications on file      Eula Listen, MD 05/03/16 2044

## 2016-08-21 DIAGNOSIS — H6123 Impacted cerumen, bilateral: Secondary | ICD-10-CM | POA: Diagnosis not present

## 2016-08-21 DIAGNOSIS — H903 Sensorineural hearing loss, bilateral: Secondary | ICD-10-CM | POA: Diagnosis not present

## 2016-11-25 ENCOUNTER — Emergency Department: Payer: PPO

## 2016-11-25 ENCOUNTER — Encounter: Payer: Self-pay | Admitting: Internal Medicine

## 2016-11-25 ENCOUNTER — Other Ambulatory Visit: Payer: Self-pay

## 2016-11-25 ENCOUNTER — Observation Stay (HOSPITAL_BASED_OUTPATIENT_CLINIC_OR_DEPARTMENT_OTHER)
Admit: 2016-11-25 | Discharge: 2016-11-25 | Disposition: A | Discharge status: Home / Self Care | Payer: PPO | Attending: Internal Medicine | Admitting: Internal Medicine

## 2016-11-25 ENCOUNTER — Observation Stay
Admission: EM | Admit: 2016-11-25 | Discharge: 2016-11-26 | Disposition: A | Discharge status: Home / Self Care | Payer: PPO | Attending: Internal Medicine | Admitting: Internal Medicine

## 2016-11-25 DIAGNOSIS — M6282 Rhabdomyolysis: Secondary | ICD-10-CM | POA: Diagnosis not present

## 2016-11-25 DIAGNOSIS — Z79899 Other long term (current) drug therapy: Secondary | ICD-10-CM | POA: Insufficient documentation

## 2016-11-25 DIAGNOSIS — F0390 Unspecified dementia without behavioral disturbance: Secondary | ICD-10-CM | POA: Diagnosis not present

## 2016-11-25 DIAGNOSIS — M6281 Muscle weakness (generalized): Secondary | ICD-10-CM | POA: Diagnosis not present

## 2016-11-25 DIAGNOSIS — J209 Acute bronchitis, unspecified: Secondary | ICD-10-CM | POA: Insufficient documentation

## 2016-11-25 DIAGNOSIS — I071 Rheumatic tricuspid insufficiency: Secondary | ICD-10-CM | POA: Insufficient documentation

## 2016-11-25 DIAGNOSIS — R748 Abnormal levels of other serum enzymes: Secondary | ICD-10-CM | POA: Diagnosis not present

## 2016-11-25 DIAGNOSIS — J40 Bronchitis, not specified as acute or chronic: Secondary | ICD-10-CM

## 2016-11-25 DIAGNOSIS — J441 Chronic obstructive pulmonary disease with (acute) exacerbation: Principal | ICD-10-CM | POA: Diagnosis present

## 2016-11-25 DIAGNOSIS — W19XXXA Unspecified fall, initial encounter: Secondary | ICD-10-CM | POA: Diagnosis not present

## 2016-11-25 DIAGNOSIS — I739 Peripheral vascular disease, unspecified: Secondary | ICD-10-CM | POA: Insufficient documentation

## 2016-11-25 DIAGNOSIS — Z853 Personal history of malignant neoplasm of breast: Secondary | ICD-10-CM | POA: Diagnosis not present

## 2016-11-25 DIAGNOSIS — I5030 Unspecified diastolic (congestive) heart failure: Secondary | ICD-10-CM | POA: Diagnosis not present

## 2016-11-25 DIAGNOSIS — E860 Dehydration: Secondary | ICD-10-CM

## 2016-11-25 DIAGNOSIS — R531 Weakness: Secondary | ICD-10-CM | POA: Diagnosis not present

## 2016-11-25 DIAGNOSIS — Z880 Allergy status to penicillin: Secondary | ICD-10-CM | POA: Insufficient documentation

## 2016-11-25 DIAGNOSIS — R102 Pelvic and perineal pain: Secondary | ICD-10-CM | POA: Diagnosis not present

## 2016-11-25 DIAGNOSIS — H9190 Unspecified hearing loss, unspecified ear: Secondary | ICD-10-CM | POA: Diagnosis not present

## 2016-11-25 DIAGNOSIS — Z7982 Long term (current) use of aspirin: Secondary | ICD-10-CM | POA: Insufficient documentation

## 2016-11-25 DIAGNOSIS — S3993XA Unspecified injury of pelvis, initial encounter: Secondary | ICD-10-CM | POA: Diagnosis not present

## 2016-11-25 DIAGNOSIS — N179 Acute kidney failure, unspecified: Secondary | ICD-10-CM | POA: Insufficient documentation

## 2016-11-25 DIAGNOSIS — H3530 Unspecified macular degeneration: Secondary | ICD-10-CM | POA: Insufficient documentation

## 2016-11-25 DIAGNOSIS — R778 Other specified abnormalities of plasma proteins: Secondary | ICD-10-CM | POA: Diagnosis present

## 2016-11-25 DIAGNOSIS — R7989 Other specified abnormal findings of blood chemistry: Secondary | ICD-10-CM | POA: Diagnosis present

## 2016-11-25 DIAGNOSIS — S299XXA Unspecified injury of thorax, initial encounter: Secondary | ICD-10-CM | POA: Diagnosis not present

## 2016-11-25 DIAGNOSIS — S0990XA Unspecified injury of head, initial encounter: Secondary | ICD-10-CM | POA: Diagnosis not present

## 2016-11-25 LAB — URINALYSIS, COMPLETE (UACMP) WITH MICROSCOPIC
BACTERIA UA: NONE SEEN
Bilirubin Urine: NEGATIVE
Glucose, UA: NEGATIVE mg/dL
Hgb urine dipstick: NEGATIVE
KETONES UR: 5 mg/dL — AB
Leukocytes, UA: NEGATIVE
Nitrite: NEGATIVE
PROTEIN: NEGATIVE mg/dL
Specific Gravity, Urine: 1.02 (ref 1.005–1.030)
pH: 5 (ref 5.0–8.0)

## 2016-11-25 LAB — CBC WITH DIFFERENTIAL/PLATELET
BASOS PCT: 1 %
Basophils Absolute: 0.1 10*3/uL (ref 0–0.1)
EOS ABS: 0 10*3/uL (ref 0–0.7)
EOS PCT: 0 %
HCT: 40.4 % (ref 35.0–47.0)
Hemoglobin: 13.4 g/dL (ref 12.0–16.0)
LYMPHS ABS: 1.3 10*3/uL (ref 1.0–3.6)
Lymphocytes Relative: 11 %
MCH: 29.8 pg (ref 26.0–34.0)
MCHC: 33.2 g/dL (ref 32.0–36.0)
MCV: 89.7 fL (ref 80.0–100.0)
MONOS PCT: 10 %
Monocytes Absolute: 1.2 10*3/uL — ABNORMAL HIGH (ref 0.2–0.9)
Neutro Abs: 8.7 10*3/uL — ABNORMAL HIGH (ref 1.4–6.5)
Neutrophils Relative %: 78 %
PLATELETS: 242 10*3/uL (ref 150–440)
RBC: 4.5 MIL/uL (ref 3.80–5.20)
RDW: 15.5 % — ABNORMAL HIGH (ref 11.5–14.5)
WBC: 11.2 10*3/uL — ABNORMAL HIGH (ref 3.6–11.0)

## 2016-11-25 LAB — BASIC METABOLIC PANEL
Anion gap: 11 (ref 5–15)
BUN: 22 mg/dL — AB (ref 6–20)
CALCIUM: 9.2 mg/dL (ref 8.9–10.3)
CO2: 22 mmol/L (ref 22–32)
CREATININE: 0.81 mg/dL (ref 0.44–1.00)
Chloride: 106 mmol/L (ref 101–111)
GFR, EST NON AFRICAN AMERICAN: 55 mL/min — AB (ref >60)
Glucose, Bld: 113 mg/dL — ABNORMAL HIGH (ref 65–99)
Potassium: 3.6 mmol/L (ref 3.5–5.1)
SODIUM: 139 mmol/L (ref 135–145)

## 2016-11-25 LAB — CK: CK TOTAL: 551 U/L — AB (ref 38–234)

## 2016-11-25 LAB — TROPONIN I
TROPONIN I: 0.06 ng/mL — AB (ref <0.03)
TROPONIN I: 0.05 ng/mL — AB (ref <0.03)
Troponin I: 0.05 ng/mL (ref <0.03)

## 2016-11-25 LAB — BRAIN NATRIURETIC PEPTIDE: B NATRIURETIC PEPTIDE 5: 226 pg/mL — AB (ref 0.0–100.0)

## 2016-11-25 MED ORDER — ACETAMINOPHEN 650 MG RE SUPP: 650.0000 mg | Freq: Four times a day (QID) | RECTAL | Status: DC | PRN

## 2016-11-25 MED ORDER — IPRATROPIUM-ALBUTEROL 0.5-2.5 (3) MG/3ML IN SOLN: 3.0000 mL | Freq: Four times a day (QID) | RESPIRATORY_TRACT | Status: DC | PRN

## 2016-11-25 MED ORDER — DOXYCYCLINE HYCLATE 100 MG PO TABS
100.0000 mg | ORAL_TABLET | Freq: Two times a day (BID) | ORAL | Status: DC
  Administered 2016-11-26: 100 mg via ORAL
  Filled 2016-11-25: qty 1

## 2016-11-25 MED ORDER — POTASSIUM CHLORIDE IN NACL 20-0.9 MEQ/L-% IV SOLN
INTRAVENOUS | Status: DC
Start: 2016-11-25 — End: 2016-11-26
  Administered 2016-11-25 – 2016-11-26 (×2): via INTRAVENOUS
  Filled 2016-11-25 (×4): qty 1000

## 2016-11-25 MED ORDER — IPRATROPIUM-ALBUTEROL 0.5-2.5 (3) MG/3ML IN SOLN
3.0000 mL | Freq: Four times a day (QID) | RESPIRATORY_TRACT | Status: DC
  Filled 2016-11-25: qty 3

## 2016-11-25 MED ORDER — IPRATROPIUM-ALBUTEROL 0.5-2.5 (3) MG/3ML IN SOLN
6.0000 mL | Freq: Once | RESPIRATORY_TRACT | Status: AC
  Administered 2016-11-25: 6 mL via RESPIRATORY_TRACT
  Filled 2016-11-25: qty 6

## 2016-11-25 MED ORDER — DOCUSATE SODIUM 100 MG PO CAPS
100.0000 mg | ORAL_CAPSULE | Freq: Two times a day (BID) | ORAL | Status: DC
  Administered 2016-11-25: 100 mg via ORAL
  Filled 2016-11-25: qty 1

## 2016-11-25 MED ORDER — PANTOPRAZOLE SODIUM 40 MG IV SOLR
40.0000 mg | Freq: Two times a day (BID) | INTRAVENOUS | Status: DC
  Administered 2016-11-25 – 2016-11-26 (×3): 40 mg via INTRAVENOUS
  Filled 2016-11-25 (×3): qty 40

## 2016-11-25 MED ORDER — ONDANSETRON HCL 4 MG/2ML IJ SOLN: 4.0000 mg | Freq: Four times a day (QID) | INTRAMUSCULAR | Status: DC | PRN

## 2016-11-25 MED ORDER — ACETAMINOPHEN 325 MG PO TABS: 650.0000 mg | ORAL_TABLET | Freq: Four times a day (QID) | ORAL | Status: DC | PRN

## 2016-11-25 MED ORDER — ONDANSETRON HCL 4 MG PO TABS: 4.0000 mg | ORAL_TABLET | Freq: Four times a day (QID) | ORAL | Status: DC | PRN

## 2016-11-25 MED ORDER — BISACODYL 10 MG RE SUPP: 10.0000 mg | Freq: Every day | RECTAL | Status: DC | PRN

## 2016-11-25 MED ORDER — HEPARIN SODIUM (PORCINE) 5000 UNIT/ML IJ SOLN
5000.0000 [IU] | Freq: Three times a day (TID) | INTRAMUSCULAR | Status: DC
  Administered 2016-11-25 – 2016-11-26 (×3): 5000 [IU] via SUBCUTANEOUS
  Filled 2016-11-25 (×3): qty 1

## 2016-11-25 MED ORDER — METHYLPREDNISOLONE SODIUM SUCC 125 MG IJ SOLR
60.0000 mg | Freq: Two times a day (BID) | INTRAMUSCULAR | Status: DC
  Administered 2016-11-26: 60 mg via INTRAVENOUS
  Filled 2016-11-25: qty 2

## 2016-11-25 MED ORDER — METHYLPREDNISOLONE SODIUM SUCC 125 MG IJ SOLR
125.0000 mg | Freq: Once | INTRAMUSCULAR | Status: AC
  Administered 2016-11-25: 125 mg via INTRAVENOUS
  Filled 2016-11-25: qty 2

## 2016-11-25 MED ORDER — ASPIRIN EC 81 MG PO TBEC
81.0000 mg | DELAYED_RELEASE_TABLET | Freq: Every day | ORAL | Status: DC
  Administered 2016-11-25 – 2016-11-26 (×2): 81 mg via ORAL
  Filled 2016-11-25 (×4): qty 1

## 2016-11-25 MED ORDER — DOXYCYCLINE HYCLATE 100 MG IV SOLR
100.0000 mg | Freq: Once | INTRAVENOUS | Status: AC
  Administered 2016-11-25: 100 mg via INTRAVENOUS
  Filled 2016-11-25: qty 100

## 2016-11-25 MED ORDER — SODIUM CHLORIDE 0.9% FLUSH
3.0000 mL | Freq: Two times a day (BID) | INTRAVENOUS | Status: DC
  Administered 2016-11-25 – 2016-11-26 (×2): 3 mL via INTRAVENOUS

## 2016-11-25 NOTE — Evaluation (Signed)
Physical Therapy Evaluation Patient Details Name: Brandi Nelson MRN: [id number] DOB: 1907-03-07 Today's Date: 11/25/2016   History of Present Illness  83 yo female with dementia fell at home and laid on the floor overnight, which son reports is due to living alone and has neighbor checking on her.  Pt has bronchitis with cough, elevated troponin and CK, dehydration with AKI, respiratory failure and chest pain with rhabdomyolysis.  PMHx:  hearing loss, breast CA, macular degeneration, COPD    Clinical Impression  Pt is up to stand with nursing in to help but is not safe and buckling at the knees due to OA pain and recent fall.  Her plan is to ask for SNF placement as she cannot go home alone, and will need help to navigate with her weakness and vision change.  Follow acutely for balance and strength and to assist pt with safe mobility bed to chair.    Follow Up Recommendations SNF    Equipment Recommendations  None recommended by PT    Recommendations for Other Services       Precautions / Restrictions Precautions Precautions: Fall(telemetry) Precaution Comments: has elevated pulse to 106 with sitting side of bed Restrictions Weight Bearing Restrictions: No      Mobility  Bed Mobility Overal bed mobility: Needs Assistance Bed Mobility: Supine to Sit;Sit to Supine     Supine to sit: Mod assist Sit to supine: Max assist   General bed mobility comments: pt can scoot back on the bed alittle but is weak  Transfers Overall transfer level: Needs assistance Equipment used: 2 person hand held assist Transfers: Sit to/from Stand Sit to Stand: Mod assist;+2 physical assistance;+2 safety/equipment         General transfer comment: pt is blind and weak, very limited to stand and sidestep  Ambulation/Gait Ambulation/Gait assistance: Mod assist Ambulation Distance (Feet): 5 Feet Assistive device: 2 person hand held assist Gait Pattern/deviations: Step-to pattern;Decreased  stride length;Trunk flexed;Narrow base of support;Shuffle;Antalgic Gait velocity: reduced Gait velocity interpretation: Below normal speed for age/gender General Gait Details: L knee pain which was pre-existing per son , received cortisone shots in it  Stairs            Wheelchair Mobility    Modified Rankin (Stroke Patients Only)       Balance Overall balance assessment: History of Falls;Needs assistance Sitting-balance support: Feet supported;Bilateral upper extremity supported Sitting balance-Leahy Scale: Poor Sitting balance - Comments: leans to R and back on bed   Standing balance support: Bilateral upper extremity supported;During functional activity Standing balance-Leahy Scale: Poor                               Pertinent Vitals/Pain Pain Assessment: Faces Faces Pain Scale: Hurts even more Pain Location: L knee which buckles in standing Pain Descriptors / Indicators: Sore Pain Intervention(s): Monitored during session;Limited activity within patient's tolerance;Repositioned    Home Living Family/patient expects to be discharged to:: Unsure                 Additional Comments: son is present but not expressing an opinion    Prior Function Level of Independence: Needs assistance   Gait / Transfers Assistance Needed: walks with no device but was recommended a year ago to get a RW  ADL's / Homemaking Assistance Needed: has been home alone with neighbor checking on her and gets some food brought in  Hand Dominance        Extremity/Trunk Assessment   Upper Extremity Assessment Upper Extremity Assessment: Generalized weakness    Lower Extremity Assessment Lower Extremity Assessment: Generalized weakness    Cervical / Trunk Assessment Cervical / Trunk Assessment: Kyphotic  Communication   Communication: HOH  Cognition Arousal/Alertness: Awake/alert Behavior During Therapy: Flat affect Overall Cognitive Status: History of  cognitive impairments - at baseline                                 General Comments: pt has dementia history      General Comments      Exercises     Assessment/Plan    PT Assessment Patient needs continued PT services  PT Problem List Decreased range of motion;Decreased strength;Decreased activity tolerance;Decreased balance;Decreased mobility;Decreased coordination;Decreased knowledge of use of DME;Decreased cognition;Decreased safety awareness;Decreased knowledge of precautions;Cardiopulmonary status limiting activity;Obesity;Decreased skin integrity;Pain       PT Treatment Interventions DME instruction;Gait training;Functional mobility training;Therapeutic activities;Therapeutic exercise;Balance training;Neuromuscular re-education    PT Goals (Current goals can be found in the Care Plan section)  Acute Rehab PT Goals Patient Stated Goal: none stated PT Goal Formulation: With patient/family Time For Goal Achievement: 12/09/16 Potential to Achieve Goals: Fair    Frequency Min 2X/week   Barriers to discharge Decreased caregiver support(home mostly alone)      Co-evaluation               AM-PAC PT "6 Clicks" Daily Activity  Outcome Measure Difficulty turning over in bed (including adjusting bedclothes, sheets and blankets)?: Unable Difficulty moving from lying on back to sitting on the side of the bed? : Unable Difficulty sitting down on and standing up from a chair with arms (e.g., wheelchair, bedside commode, etc,.)?: Unable Help needed moving to and from a bed to chair (including a wheelchair)?: A Lot Help needed walking in hospital room?: A Lot Help needed climbing 3-5 steps with a railing? : Total 6 Click Score: 8    End of Session Equipment Utilized During Treatment: Oxygen Activity Tolerance: Patient limited by fatigue;Patient limited by lethargy Patient left: in bed;with call bell/phone within reach;with family/visitor present;with  nursing/sitter in room;Other (comment)(being transported to the floor) Nurse Communication: Mobility status PT Visit Diagnosis: Unsteadiness on feet (R26.81);Muscle weakness (generalized) (M62.81);History of falling (Z91.81);Adult, failure to thrive (R62.7)    Time: 2706-2376 PT Time Calculation (min) (ACUTE ONLY): 32 min   Charges:   PT Evaluation $PT Eval Moderate Complexity: 1 Mod PT Treatments $Therapeutic Activity: 8-22 mins   PT G Codes:   PT G-Codes **NOT FOR INPATIENT CLASS** Functional Assessment Tool Used: AM-PAC 6 Clicks Basic Mobility Functional Limitation: Mobility: Walking and moving around Mobility: Walking and Moving Around Current Status (E8315): At least 80 percent but less than 100 percent impaired, limited or restricted Mobility: Walking and Moving Around Goal Status [phone]): At least 1 percent but less than 20 percent impaired, limited or restricted    Ramond Dial 11/25/2016, 1:21 PM   Mee Hives, PT MS Acute Rehab Dept. Number: [place]

## 2016-11-25 NOTE — ED Triage Notes (Signed)
Pt presents via EMS s/p fall. Found this morning by friend per EMS. Pt lives by herself per report. Denies remembering falling. MD at bedside.

## 2016-11-25 NOTE — ED Notes (Signed)
Delay in transfer due to physical therapy in room assessing. Will transfer when able.

## 2016-11-25 NOTE — Progress Notes (Signed)
*  PRELIMINARY RESULTS* Echocardiogram 2D Echocardiogram has been performed.  Rhea Thrun S Ash[place] Grundman 11/25/2016, 5:26 PM 

## 2016-11-25 NOTE — Care Management Note (Signed)
Case Management Note  Patient Details  Name: KIANDRA SANGUINETTI MRN: [id number] Date of Birth: 10/15/1907  Subjective/Objective:     Discussed discharge planning face to face with sons [name] and Hollice Espy. Advised them that the family needs to make some definite plans about 83yo Mrs Warrior. She is almost deaf and visually impaired and was admitted after a neighbor found her in the floor of her home unable to get up. Mrs Fronczak lives alone. Strongly advised sons that if Mrs Voshell insists upon returning to her home that someone needs to be with her at night in case of an emergency. Both sons and a neighbor currently check on Mrs Goodgame during the day and take her out to eat for her meals.                Action/Plan:   Expected Discharge Date:                  Expected Discharge Plan:     In-House Referral:     Discharge planning Services     Post Acute Care Choice:    Choice offered to:     DME Arranged:    DME Agency:     HH Arranged:    HH Agency:     Status of Service:     If discussed at [employer] of Stay Meetings, dates discussed:    Additional Comments:  Amarri Satterly A, RN 11/25/2016, 4:05 PM

## 2016-11-25 NOTE — Progress Notes (Signed)
Pt arrived to unit. Pt alert but confused at times. Hx of dementia. Skin assessment completed with Wilhemena Durie, RN. IV fluids started. Bed alarm on. Pt on room air. Lunch tray ordered for pt. External catheter placed. No complaints at this time.

## 2016-11-25 NOTE — ED Provider Notes (Signed)
[facility] Emergency Department Provider Note  ____________________________________________   None    (approximate)  I have reviewed the triage vital signs and the nursing notes.   HISTORY  Chief Complaint Fall   HPI Brandi Nelson is a 83 y.o. female with a history of COPD as well as breast cancer status post right-sided mastectomy about 40 years ago as well as short-term memory loss was presented to the emergency department today with weakness, a fall and cough since yesterday.  The patient was found by her neighbor but just prior to arrival on the floor next to her bed.  The patient's son, who is at the bedside thinks that the patient had fallen sometime yesterday and had struggled to get up during the night.  The patient denies any pain.  Does not remember falling or the circumstances around her fall.  The son says this is consistent with her short-term memory loss that is worsened over the past 2 years.  EMS in route said that the patient had wheezing throughout and was given 1 nebulizer treatment in route.  However, her oxygen saturation have been 98%.  Patient is denying any pain.    Past Medical History:  Diagnosis Date  . Cancer (HCC)    breast  . Hearing loss   . Macular degeneration     Patient Active Problem List   Diagnosis Date Noted  . Respiratory failure ([place]) 11/29/2015  . COPD exacerbation ([place]) 11/29/2015  . Chest pain 08/30/2014  . Elevated troponin 08/30/2014    Past Surgical History:  Procedure Laterality Date  . MASTECTOMY      Prior to Admission medications   Medication Sig Start Date End Date Taking? Authorizing Provider  aspirin EC 81 MG EC tablet Take 1 tablet (81 mg total) by mouth daily. 08/31/14   Fritzi Mandes, MD  guaiFENesin (MUCINEX) 600 MG 12 hr tablet Take 1 tablet (600 mg total) by mouth 2 (two) times daily. 12/01/15   Dustin Flock, MD  guaiFENesin-dextromethorphan (ROBITUSSIN DM) 100-10 MG/5ML syrup Take 5  mLs by mouth every 4 (four) hours as needed for cough. 12/01/15   Dustin Flock, MD  Ipratropium-Albuterol (COMBIVENT RESPIMAT) 20-100 MCG/ACT AERS respimat Inhale 1 puff into the lungs every 6 (six) hours. 12/01/15   Dustin Flock, MD  predniSONE (STERAPRED UNI-PAK 21 TAB) 10 MG (21) TBPK tablet Take 1 tablet (10 mg total) by mouth daily. Start at 60mg  taper by 10mg  until complete 12/01/15   Dustin Flock, MD    Allergies Penicillin g  Family History  Family history unknown: Yes    Social History Social History   Tobacco Use  . Smoking status: Never Smoker  . Smokeless tobacco: Never Used  Substance Use Topics  . Alcohol use: No  . Drug use: Not on file    Review of Systems  Constitutional: No fever/chills Eyes: No visual changes. ENT: No sore throat. Cardiovascular: Denies chest pain. Respiratory: As above  gastrointestinal: No abdominal pain.  No nausea, no vomiting.  No diarrhea.  No constipation. Genitourinary: Negative for dysuria. Musculoskeletal: Negative for back pain. Skin: Negative for rash. Neurological: Negative for headaches, focal weakness or numbness.   ____________________________________________   PHYSICAL EXAM:  VITAL SIGNS: ED Triage Vitals  Enc Vitals Group     BP 11/25/16 0825 (!) 144/89     Pulse Rate 11/25/16 0825 95     Resp 11/25/16 0825 18     Temp 11/25/16 0823 (!) 97.5 F (36.4 C)  Temp Source 11/25/16 0823 Oral     SpO2 11/25/16 0825 100 %     Weight 11/25/16 0821 137 lb (62.1 kg)     Height 11/25/16 0821 4\' 11"  (1.499 m)     Head Circumference --      Peak Flow --      Pain Score --      Pain Loc --      Pain Edu? --      Excl. in [place]? --     Constitutional: Alert and oriented. Well appearing and in no acute distress. Eyes: Conjunctivae are normal.  Head: Atraumatic. Nose: No congestion/rhinnorhea. Mouth/Throat: Mucous membranes are moist.  Neck: No stridor.   Cardiovascular: Normal rate, regular rhythm.  Grossly normal heart sounds.   Respiratory: Normal respiratory effort.  Wheezing with expiratory coughing.  Speaks in full sentences.  Mild rales to the left lower base. Gastrointestinal: Soft and nontender. No distention. No CVA tenderness. Musculoskeletal: No lower extremity tenderness nor edema.  No joint effusions.  No limb shortening.  No tenderness to the hips, bilaterally. Neurologic:  Normal speech and language. No gross focal neurologic deficits are appreciated. Skin:  Skin is warm, dry and intact. No rash noted. Psychiatric: Mood and affect are normal. Speech and behavior are normal.  ____________________________________________   LABS (all labs ordered are listed, but only abnormal results are displayed)  Labs Reviewed - No data to display ____________________________________________  EKG  ED ECG REPORT I, Doran Stabler, the attending physician, personally viewed and interpreted this ECG.   Date: 11/25/2016  EKG Time: 825  Rate: 92  Rhythm: normal sinus rhythm  Axis: Normal  Intervals:right bundle branch block  ST&T Change: No ST segment elevation or depression.  T wave inversion in lead III, aVF and V3.  Difficult to interpret some of the leads secondary to static in the baseline. No significant change from previous EKG on 03 May 2016 ____________________________________________  RADIOLOGY  No acute finding on the patient's imaging. ____________________________________________   PROCEDURES  Procedure(s) performed:   Procedures  Critical Care performed:   ____________________________________________   INITIAL IMPRESSION / ASSESSMENT AND PLAN / ED COURSE  Pertinent labs & imaging results that were available during my care of the patient were reviewed by me and considered in my medical decision making (see chart for details).  Differential includes, but is not limited to, viral syndrome, bronchitis including COPD exacerbation, pneumonia, reactive  airway disease including asthma, CHF including exacerbation with or without pulmonary/interstitial edema, pneumothorax, ACS, thoracic trauma, and pulmonary embolism.  As part of my medical decision making, I reviewed the following data within the electronic MEDICAL RECORD NUMBER Notes from prior ED visits  Last echo 1 year ago showed an EF of 55-60%.    ----------------------------------------- 9:43 AM on 11/25/2016 -----------------------------------------  Patient with slight troponin bump as well as elevated CK.  Continues to have cough.  We will continue with nebs.  Will add steroids and antibiotics.  Urinalysis is pending.  To be admitted to the hospital.  Patient's son is at the bedside and says that the patient is also been progressively more forgetful over the last 3 days.  Patient signed out to Dr.  Tressia Miners.   ____________________________________________   FINAL CLINICAL IMPRESSION(S) / ED DIAGNOSES  Delirium.  Bronchitis.  Generalized weakness.  Rhabdomyolysis.   NEW MEDICATIONS STARTED DURING THIS VISIT:  This SmartLink is deprecated. Use AVSMEDLIST instead to display the medication list for a patient.   Note:  This document  was prepared using Systems analyst and may include unintentional dictation errors.     Orbie Pyo, MD 11/25/16 [phone]

## 2016-11-25 NOTE — Care Management Note (Addendum)
Case Management Note  Patient Details  Name: Brandi Nelson MRN: [id number] Date of Birth: 1907-05-06  Subjective/Objective:    83yo Mrs Brandi Nelson was found in the floor by a neighbor this morning unable to get up. [name] has dementia, is extremely hard of hearing, and is almost blind per her son Brandi Nelson. Roger provided the following information: [name] doctor retired and she has not seen a doctor in over a year.Her only income is her social security benefit. [name] lives alone. A neighbor takes her to breakfast every morning and son Brandi Nelson takes [name] to a late lunch every day. She is at home alone at night. She has a walker but does not use it. She has 2 sons who live locally and a daughter who  lives in [place], [place]. Pharmacy=[facility] on corner of [address]. No home oxygen, no home health services. Son Brandi Nelson provides transportation. [facility]-PT is currently recommending SNF. Son Brandi Nelson verbalized his desire to discuss options with siblings but states that none of the three children are able to take [name] into their homes. Case Management will follow in the event that SNF is not an option.                 Action/Plan:   Expected Discharge Date:                  Expected Discharge Plan:     In-House Referral:     Discharge planning Services     Post Acute Care Choice:    Choice offered to:     DME Arranged:    DME Agency:     HH Arranged:   .  [place] Agency:     Status of Service:     If discussed at [place] of Stay Meetings, dates discussed:    Additional Comments:  Sharica Roedel A, RN 11/25/2016, 2:19 PM

## 2016-11-25 NOTE — ED Notes (Signed)
Unable to give report at this time per secretary. Will call back.

## 2016-11-25 NOTE — H&P (Signed)
History and Physical    Brandi Nelson:[id number] DOB: 1907-10-02 DOA: 11/25/2016  Referring physician: Dr. Clearnce Hasten PCP: Patient, No Pcp Per  Specialists: none  Chief Complaint: weakness  HPI: Brandi Nelson is a 83 y.o. female has a past medical history significant for breast cancer and senile dementia who lives alon who was brought to ER after being found on the floor of her bedroom for an unknown amount of time by her neighbor. Son says she is very weak. Has had cough and congestion. No fever. No N/V/D. Pt unable to provide hx due to dementia. In ER, CK and troponin elevated. Also with dehydration and AKI. She is now admitted.  Review of Systems: unable to obtain from pt due to dementia  Past Medical History:  Diagnosis Date  . Cancer (HCC)    breast  . Hearing loss   . Macular degeneration    Past Surgical History:  Procedure Laterality Date  . MASTECTOMY     Social History:  reports that  has never smoked. she has never used smokeless tobacco. She reports that she does not drink alcohol. Her drug history is not on file.  Allergies  Allergen Reactions  . Penicillin G     Has patient had a PCN reaction causing immediate rash, facial/tongue/throat swelling, SOB or lightheadedness with hypotension: no  Has patient had a PCN reaction causing severe rash involving mucus membranes or skin necrosis: no Has patient had a PCN reaction that required hospitalization no Has patient had a PCN reaction occurring within the last 10 years: no If all of the above answers are "NO", then may proceed with Cephalosporin use.     Family History  Family history unknown: Yes    Prior to Admission medications   Medication Sig Start Date End Date Taking? Authorizing Provider  aspirin EC 81 MG EC tablet Take 1 tablet (81 mg total) by mouth daily. Patient not taking: Reported on 11/25/2016 08/31/14   Fritzi Mandes, MD  Ipratropium-Albuterol (COMBIVENT RESPIMAT) 20-100 MCG/ACT AERS  respimat Inhale 1 puff into the lungs every 6 (six) hours. Patient not taking: Reported on 11/25/2016 12/01/15   Dustin Flock, MD   Physical Exam: Vitals:   11/25/16 3235 11/25/16 0823 11/25/16 0825  BP:   (!) 144/89  Pulse:   95  Resp:   18  Temp:  (!) 97.5 F (36.4 C)   TempSrc:  Oral   SpO2:   100%  Weight: 62.1 kg (137 lb)    Height: 4\' 11"  (1.499 m)       General:  No apparent distress, WDWN, [place]/AT  Eyes: PERRL, EOMI, no scleral icterus, conjunctiva clear  ENT: dry oropharynx without exudate, TM's dull, dentition fair  Neck: supple, no lymphadenopathy. No thryomegaly. Bilateral bruits noted  Cardiovascular: regular rate without MRG; 2+ peripheral pulses, no JVD, no peripheral edema  Respiratory: diffuse rhonchi without wheezes or rales. No dullness. Respiratory effort normal  Abdomen: soft, non tender to palpation, positive bowel sounds, no guarding, no rebound  Skin: no rashes or lesions  Musculoskeletal: normal bulk and tone, no joint swelling  Psychiatric: normal mood and affect, oriented to person only  Neurologic: CN 2-12 grossly intact, Motor 5/5 in all 4 groups with symmetric DTR's and non-focal sensory exam  Labs on Admission:  Basic Metabolic Panel: Recent Labs  Lab 11/25/16 0826  NA 139  K 3.6  CL 106  CO2 22  GLUCOSE 113*  BUN 22*  CREATININE 0.81  CALCIUM 9.2  Liver Function Tests: No results for input(s): AST, ALT, ALKPHOS, BILITOT, PROT, ALBUMIN in the last 168 hours. No results for input(s): LIPASE, AMYLASE in the last 168 hours. No results for input(s): AMMONIA in the last 168 hours. CBC: Recent Labs  Lab 11/25/16 0826  WBC 11.2*  NEUTROABS 8.7*  HGB 13.4  HCT 40.4  MCV 89.7  PLT 242   Cardiac Enzymes: Recent Labs  Lab 11/25/16 0826  CKTOTAL 551*  TROPONINI 0.06*    BNP (last 3 results) Recent Labs    11/29/15 1426 11/25/16 0937  BNP 140.0* 226.0*    ProBNP (last 3 results) No results for input(s): PROBNP  in the last 8760 hours.  CBG: No results for input(s): GLUCAP in the last 168 hours.  Radiological Exams on Admission: Dg Chest 2 View  Result Date: 11/25/2016 CLINICAL DATA:  Unwitnessed fall last night. EXAM: CHEST  2 VIEW COMPARISON:  May 03, 2016 FINDINGS: Chronic changes in the left humerus consistent with previous healed fracture. Chronic changes in the proximal right humerus. Visualized bones otherwise normal. No pneumothorax. Stable cardiomegaly and torturous thoracic aorta. The hila and mediastinum are otherwise normal. No pulmonary nodules, masses, or focal infiltrates. No obvious changes identified in the thoracic spine on limited views. IMPRESSION: No acute abnormality noted. Electronically Signed   By: Dorise Bullion III M.D   On: 11/25/2016 09:15   Dg Pelvis 1-2 Views  Result Date: 11/25/2016 CLINICAL DATA:  Unwitnessed fall.  Pain. EXAM: PELVIS - 1-2 VIEW COMPARISON:  None. FINDINGS: There is no evidence of pelvic fracture or diastasis. No pelvic bone lesions are seen. IMPRESSION: Negative. Electronically Signed   By: Dorise Bullion III M.D   On: 11/25/2016 09:16   Ct Head Wo Contrast  Result Date: 11/25/2016 CLINICAL DATA:  Fall last night.  Found on floor. EXAM: CT HEAD WITHOUT CONTRAST TECHNIQUE: Contiguous axial images were obtained from the base of the skull through the vertex without intravenous contrast. COMPARISON:  10/22/2015 FINDINGS: Brain: There is atrophy and chronic small vessel disease changes. No acute intracranial abnormality. Specifically, no hemorrhage, hydrocephalus, mass lesion, acute infarction, or significant intracranial injury. Vascular: No hyperdense vessel or unexpected calcification. Skull: No acute calvarial abnormality. Sinuses/Orbits: Visualized paranasal sinuses and mastoids clear. Orbital soft tissues unremarkable. Other: None IMPRESSION: No acute intracranial abnormality. Atrophy, chronic microvascular disease. Electronically Signed   By: Rolm Baptise M.D.   On: 11/25/2016 09:05    EKG: Independently reviewed.  Assessment/Plan Principal Problem:   COPD exacerbation (HCC) Active Problems:   Elevated troponin   Dehydration   Rhabdomyolysis   Will observe on floor as DNR. Begin IV fluids with IV steroids and empiric po ABX and SVN's. Echo ordered. Follow troponin and CK's. Consult PT and CSW. Repeat labs in AM  Diet: soft Fluids: NS with K+@75  DVT Prophylaxis: SQ Heparin  Code Status: DNR  Family Communication: yes  Disposition Plan: ALF  Time spent: 50 min

## 2016-11-25 NOTE — ED Notes (Signed)
Pt transported to room 137. 

## 2016-11-26 DIAGNOSIS — R748 Abnormal levels of other serum enzymes: Secondary | ICD-10-CM | POA: Diagnosis not present

## 2016-11-26 DIAGNOSIS — J441 Chronic obstructive pulmonary disease with (acute) exacerbation: Secondary | ICD-10-CM | POA: Diagnosis not present

## 2016-11-26 DIAGNOSIS — E860 Dehydration: Secondary | ICD-10-CM | POA: Diagnosis not present

## 2016-11-26 DIAGNOSIS — M6282 Rhabdomyolysis: Secondary | ICD-10-CM | POA: Diagnosis not present

## 2016-11-26 LAB — ECHOCARDIOGRAM COMPLETE
HEIGHTINCHES: 59 in
WEIGHTICAEL: 2192 [oz_av]

## 2016-11-26 LAB — COMPREHENSIVE METABOLIC PANEL
ALBUMIN: 3.3 g/dL — AB (ref 3.5–5.0)
ALK PHOS: 124 U/L (ref 38–126)
ALT: 103 U/L — AB (ref 14–54)
AST: 41 U/L (ref 15–41)
Anion gap: 8 (ref 5–15)
BILIRUBIN TOTAL: 0.9 mg/dL (ref 0.3–1.2)
BUN: 24 mg/dL — AB (ref 6–20)
CALCIUM: 8.5 mg/dL — AB (ref 8.9–10.3)
CO2: 22 mmol/L (ref 22–32)
Chloride: 106 mmol/L (ref 101–111)
Creatinine, Ser: 0.86 mg/dL (ref 0.44–1.00)
GFR calc Af Amer: 59 mL/min — ABNORMAL LOW (ref >60)
GFR calc non Af Amer: 51 mL/min — ABNORMAL LOW (ref >60)
GLUCOSE: 123 mg/dL — AB (ref 65–99)
Potassium: 4.3 mmol/L (ref 3.5–5.1)
Sodium: 136 mmol/L (ref 135–145)
TOTAL PROTEIN: 5.9 g/dL — AB (ref 6.5–8.1)

## 2016-11-26 LAB — CBC
HCT: 33.9 % — ABNORMAL LOW (ref 35.0–47.0)
Hemoglobin: 11.7 g/dL — ABNORMAL LOW (ref 12.0–16.0)
MCH: 30.7 pg (ref 26.0–34.0)
MCHC: 34.6 g/dL (ref 32.0–36.0)
MCV: 88.7 fL (ref 80.0–100.0)
Platelets: 223 10*3/uL (ref 150–440)
RBC: 3.82 MIL/uL (ref 3.80–5.20)
RDW: 15.2 % — AB (ref 11.5–14.5)
WBC: 10.7 10*3/uL (ref 3.6–11.0)

## 2016-11-26 LAB — TROPONIN I: Troponin I: 0.07 ng/mL (ref <0.03)

## 2016-11-26 LAB — CK: Total CK: 200 U/L (ref 38–234)

## 2016-11-26 MED ORDER — DOXYCYCLINE HYCLATE 100 MG PO TABS: 100.0000 mg | ORAL_TABLET | Freq: Two times a day (BID) | ORAL | 0 refills | Status: DC

## 2016-11-26 MED ORDER — PREDNISONE 10 MG (21) PO TBPK: ORAL_TABLET | ORAL | 0 refills | Status: DC

## 2016-11-26 MED ORDER — IPRATROPIUM-ALBUTEROL 20-100 MCG/ACT IN AERS: 1.0000 | INHALATION_SPRAY | Freq: Four times a day (QID) | RESPIRATORY_TRACT | 2 refills | Status: DC

## 2016-11-26 MED ORDER — BENZONATATE 100 MG PO CAPS: 100.0000 mg | ORAL_CAPSULE | Freq: Three times a day (TID) | ORAL | 0 refills | Status: DC

## 2016-11-26 NOTE — Discharge Summary (Signed)
[facility] NAME: Brandi Nelson    MR#:  [id number]  DATE OF BIRTH:  1908/01/02  DATE OF ADMISSION:  11/25/2016   ADMITTING PHYSICIAN: Idelle Crouch, MD  DATE OF DISCHARGE:  11/26/16  PRIMARY CARE PHYSICIAN: Madelyn Brunner, MD   ADMISSION DIAGNOSIS:   Bronchitis [J40] Generalized weakness [R53.1] Non-traumatic rhabdomyolysis [M62.82]  DISCHARGE DIAGNOSIS:   Principal Problem:   COPD exacerbation ([place]) Active Problems:   Elevated troponin   Dehydration   Rhabdomyolysis   SECONDARY DIAGNOSIS:   Past Medical History:  Diagnosis Date  . Cancer (HCC)    breast  . Hearing loss   . Macular degeneration     HOSPITAL COURSE:   83 year old female with past medical history significant for history of breast cancer, senile dementia, hearing loss and impaired vision due to macular degeneration was brought in after she was found on the floor.  #1 rhabdomyolysis and acute renal failure-secondary to the fall. Improved with IV fluids. -Physical therapy encourage to rehabilitation due to physical restrictions including visual and hearing impairment. -However family is deciding to take the patient home and provide additional support at home. -Home health will be set up, rolling walker and bedside commode are prescribed  #2 acute bronchitis-has a chronic cough. No significant wheezing on exam. Discontinue IV steroids and discharged on a prednisone taper. Continue doxycycline to finish off the course -Also cough medications  -Continue Combivent inhaler  #3 elevated troponin-secondary to the fall and demand ischemia. Denies any chest pain. No further workup  Patient will be discharged home today  DISCHARGE CONDITIONS:   Guarded  CONSULTS OBTAINED:   None  DRUG ALLERGIES:   Allergies  Allergen Reactions  . Penicillin G     Has patient had a PCN reaction causing immediate rash, facial/tongue/throat swelling,  SOB or lightheadedness with hypotension: no  Has patient had a PCN reaction causing severe rash involving mucus membranes or skin necrosis: no Has patient had a PCN reaction that required hospitalization no Has patient had a PCN reaction occurring within the last 10 years: no If all of the above answers are "NO", then may proceed with Cephalosporin use.    DISCHARGE MEDICATIONS:   Allergies as of 11/26/2016      Reactions   Penicillin G    Has patient had a PCN reaction causing immediate rash, facial/tongue/throat swelling, SOB or lightheadedness with hypotension: no Has patient had a PCN reaction causing severe rash involving mucus membranes or skin necrosis: no Has patient had a PCN reaction that required hospitalization no Has patient had a PCN reaction occurring within the last 10 years: no If all of the above answers are "NO", then may proceed with Cephalosporin use.      Medication List    STOP taking these medications   aspirin 81 MG EC tablet     TAKE these medications   benzonatate 100 MG capsule Commonly known as:  TESSALON PERLES Take 1 capsule (100 mg total) 3 (three) times daily by mouth.   doxycycline 100 MG tablet Commonly known as:  VIBRA-TABS Take 1 tablet (100 mg total) every 12 (twelve) hours by mouth. X 9 days   Ipratropium-Albuterol 20-100 MCG/ACT Aers respimat Commonly known as:  COMBIVENT RESPIMAT Inhale 1 puff every 6 (six) hours into the lungs.   predniSONE 10 MG (21) Tbpk tablet Commonly known as:  STERAPRED UNI-PAK 21 TAB 6 tabs PO x 1 day 5 tabs PO  x 1 day 4 tabs PO x 1 day 3 tabs PO x 1 day 2 tabs PO x 1 day 1 tab PO x 1 day and stop            Durable Medical Equipment  (From admission, onward)        Start     Ordered   11/26/16 0922  For home use only DME Bedside commode  Once    Question:  Patient needs a bedside commode to treat with the following condition  Answer:  Fall   11/26/16 0921   11/26/16 0922  For home use only  DME Walker rolling  Once    Question:  Patient needs a walker to treat with the following condition  Answer:  Visual impairment   11/26/16 0921       DISCHARGE INSTRUCTIONS:   1. PCP follow-up in 1-2 weeks  DIET:   Cardiac diet  ACTIVITY:   Activity as tolerated  OXYGEN:   Home Oxygen: No.  Oxygen Delivery: room air  DISCHARGE LOCATION:   home   If you experience worsening of your admission symptoms, develop shortness of breath, life threatening emergency, suicidal or homicidal thoughts you must seek medical attention immediately by calling 911 or calling your MD immediately  if symptoms less severe.  You Must read complete instructions/literature along with all the possible adverse reactions/side effects for all the Medicines you take and that have been prescribed to you. Take any new Medicines after you have completely understood and accpet all the possible adverse reactions/side effects.   Please note  You were cared for by a hospitalist during your hospital stay. If you have any questions about your discharge medications or the care you received while you were in the hospital after you are discharged, you can call the unit and asked to speak with the hospitalist on call if the hospitalist that took care of you is not available. Once you are discharged, your primary care physician will handle any further medical issues. Please note that NO REFILLS for any discharge medications will be authorized once you are discharged, as it is imperative that you return to your primary care physician (or establish a relationship with a primary care physician if you do not have one) for your aftercare needs so that they can reassess your need for medications and monitor your lab values.    On the day of Discharge:  VITAL SIGNS:   Blood pressure 131/67, pulse 84, temperature 98 F (36.7 C), temperature source Oral, resp. rate 18, height 4\' 11"  (1.499 m), weight 60.4 kg (133 lb 3.2 oz),  SpO2 95 %.  PHYSICAL EXAMINATION:    GENERAL:  83 y.o.-year-old elderly patient lying in the bed with no acute distress. Very hard of hearing EYES: Pupils equal, round, reactive to light and accommodation. Decreased visual acuity. No scleral icterus. Extraocular muscles intact.  HEENT: Head atraumatic, normocephalic. Oropharynx and nasopharynx clear.  NECK:  Supple, no jugular venous distention. No thyroid enlargement, no tenderness.  LUNGS: Normal breath sounds bilaterally, no wheezing, rales,rhonchi or crepitation. No use of accessory muscles of respiration. Decreased bibasilar breath sounds CARDIOVASCULAR: S1, S2 normal. No  rubs, or gallops. 2/6 systolic murmur present ABDOMEN: Soft, non-tender, non-distended. Bowel sounds present. No organomegaly or mass.  EXTREMITIES: No pedal edema, cyanosis, or clubbing.  NEUROLOGIC: Cranial nerves II through XII are intact except decreased hearing nad vision.  Muscle strength 5/5 in all extremities. Sensation intact. Gait not checked.  Global weakness  noted. PSYCHIATRIC: The patient is alert and oriented x 2.  SKIN: No obvious rash, lesion, or ulcer.   DATA REVIEW:   CBC Recent Labs  Lab 11/26/16 0055  WBC 10.7  HGB 11.7*  HCT 33.9*  PLT 223    Chemistries  Recent Labs  Lab 11/26/16 0055  NA 136  K 4.3  CL 106  CO2 22  GLUCOSE 123*  BUN 24*  CREATININE 0.86  CALCIUM 8.5*  AST 41  ALT 103*  ALKPHOS 124  BILITOT 0.9     Microbiology Results  No results found for this or any previous visit.  RADIOLOGY:  No results found.   Management plans discussed with the patient, family and they are in agreement.  CODE STATUS:     Code Status Orders  (From admission, onward)        Start     Ordered   11/25/16 1243  Do not attempt resuscitation (DNR)  Continuous    Question Answer Comment  In the event of cardiac or respiratory ARREST Do not call a "code blue"   In the event of cardiac or respiratory ARREST Do not  perform Intubation, CPR, defibrillation or ACLS   In the event of cardiac or respiratory ARREST Use medication by any route, position, wound care, and other measures to relive pain and suffering. May use oxygen, suction and manual treatment of airway obstruction as needed for comfort.      11/25/16 1242    Code Status History    Date Active Date Inactive Code Status Order ID Comments User Context   11/29/2015 22:10 12/01/2015 17:06 Full Code [id number]  Gladstone Lighter, MD Inpatient   08/30/2014 18:14 08/31/2014 20:11 DNR [id number]  Idelle Crouch, MD Inpatient    Advance Directive Documentation     Most Recent Value  Type of Advance Directive  Out of facility DNR (pink MOST or yellow form)  Pre-existing out of facility DNR order (yellow form or pink MOST form)  No data  "MOST" Form in Place?  No data      TOTAL TIME TAKING CARE OF THIS PATIENT: 37 minutes.    Gladstone Lighter M.D on 11/26/2016 at 9:24 AM  Between 7am to 6pm - Pager - [phone]  After 6pm go to [email or web address] - Proofreader  [facility]  Office  [phone]  CC: Primary care physician; Madelyn Brunner, MD   Note: This dictation was prepared with Dragon dictation along with smaller phrase technology. Any transcriptional errors that result from this process are unintentional.

## 2016-11-26 NOTE — Care Management Note (Signed)
Case Management Note  Patient Details  Name: Brandi Nelson MRN: [id number] Date of Birth: 05/03/1907  Subjective/Objective:   Discussed discharge planning with family. A referral for HH=PT, RN, Aide, SW was called to [place] [facility]. A request for delivery of a RW and BSC was called to Brad at [facility] to be delivered to Mrs [facility] hospital room today.                 Action/Plan:   Expected Discharge Date:  11/26/16               Expected Discharge Plan:  [place]  In-House Referral:     Discharge planning Services  CM Consult  Post Acute Care Choice:  Home Health, Durable Medical Equipment Choice offered to:  Adult Children  DME Arranged:  3-N-1 DME Agency:  [facility] Arranged:  RN, PT, Nurse's Aide, Social Work [employer] Agency:  [place] [facility]  Status of Service:  Completed, signed off  If discussed at [employer], dates discussed:    Additional Comments:  Josalynn Johndrow A, RN 11/26/2016, 9:36 AM

## 2016-11-26 NOTE — Clinical Social Work Note (Signed)
CSW received update from the attending MD that the patient's family is choosing to return home with her with home health orders. CSW is signing off. Please consult should new needs arise or if the family changes mind.  Santiago Bumpers, MSW, Latanya Presser [phone]

## 2016-11-26 NOTE — Plan of Care (Signed)
Up to bedside commode with 1 assist this shift.  Minimal dyspnea on exertion noted.

## 2016-11-26 NOTE — Progress Notes (Signed)
DISCHARGE NOTE:  Pt and granddaughter given discharge instructions and prescriptions. Pt verbalized understanding. Pt waiting on home equipment to be delivered to room.

## 2016-11-27 DIAGNOSIS — H9190 Unspecified hearing loss, unspecified ear: Secondary | ICD-10-CM | POA: Diagnosis not present

## 2016-11-27 DIAGNOSIS — Z7952 Long term (current) use of systemic steroids: Secondary | ICD-10-CM | POA: Diagnosis not present

## 2016-11-27 DIAGNOSIS — Z9181 History of falling: Secondary | ICD-10-CM | POA: Diagnosis not present

## 2016-11-27 DIAGNOSIS — J209 Acute bronchitis, unspecified: Secondary | ICD-10-CM | POA: Diagnosis not present

## 2016-11-27 DIAGNOSIS — F0390 Unspecified dementia without behavioral disturbance: Secondary | ICD-10-CM | POA: Diagnosis not present

## 2016-11-27 DIAGNOSIS — M6282 Rhabdomyolysis: Secondary | ICD-10-CM | POA: Diagnosis not present

## 2016-11-27 DIAGNOSIS — J440 Chronic obstructive pulmonary disease with acute lower respiratory infection: Secondary | ICD-10-CM | POA: Diagnosis not present

## 2016-11-27 DIAGNOSIS — H3530 Unspecified macular degeneration: Secondary | ICD-10-CM | POA: Diagnosis not present

## 2016-11-27 DIAGNOSIS — Z792 Long term (current) use of antibiotics: Secondary | ICD-10-CM | POA: Diagnosis not present

## 2016-11-27 DIAGNOSIS — J441 Chronic obstructive pulmonary disease with (acute) exacerbation: Secondary | ICD-10-CM | POA: Diagnosis not present

## 2016-11-27 DIAGNOSIS — I10 Essential (primary) hypertension: Secondary | ICD-10-CM | POA: Diagnosis not present

## 2016-11-27 DIAGNOSIS — M1711 Unilateral primary osteoarthritis, right knee: Secondary | ICD-10-CM | POA: Diagnosis not present

## 2016-11-27 DIAGNOSIS — Z853 Personal history of malignant neoplasm of breast: Secondary | ICD-10-CM | POA: Diagnosis not present

## 2016-12-11 DIAGNOSIS — F0390 Unspecified dementia without behavioral disturbance: Secondary | ICD-10-CM | POA: Diagnosis not present

## 2016-12-11 DIAGNOSIS — J441 Chronic obstructive pulmonary disease with (acute) exacerbation: Secondary | ICD-10-CM | POA: Diagnosis not present

## 2016-12-11 DIAGNOSIS — J209 Acute bronchitis, unspecified: Secondary | ICD-10-CM | POA: Diagnosis not present

## 2016-12-11 DIAGNOSIS — J440 Chronic obstructive pulmonary disease with acute lower respiratory infection: Secondary | ICD-10-CM | POA: Diagnosis not present

## 2016-12-13 DIAGNOSIS — H6123 Impacted cerumen, bilateral: Secondary | ICD-10-CM | POA: Diagnosis not present

## 2016-12-13 DIAGNOSIS — H903 Sensorineural hearing loss, bilateral: Secondary | ICD-10-CM | POA: Diagnosis not present

## 2017-05-28 DIAGNOSIS — L02619 Cutaneous abscess of unspecified foot: Secondary | ICD-10-CM | POA: Diagnosis not present

## 2017-05-28 DIAGNOSIS — M79675 Pain in left toe(s): Secondary | ICD-10-CM | POA: Diagnosis not present

## 2017-05-28 DIAGNOSIS — M79674 Pain in right toe(s): Secondary | ICD-10-CM | POA: Diagnosis not present

## 2017-05-28 DIAGNOSIS — L97311 Non-pressure chronic ulcer of right ankle limited to breakdown of skin: Secondary | ICD-10-CM | POA: Diagnosis not present

## 2017-05-28 DIAGNOSIS — L03119 Cellulitis of unspecified part of limb: Secondary | ICD-10-CM | POA: Diagnosis not present

## 2017-05-28 DIAGNOSIS — B351 Tinea unguium: Secondary | ICD-10-CM | POA: Diagnosis not present

## 2017-06-05 ENCOUNTER — Ambulatory Visit: Payer: Self-pay | Admitting: Podiatry

## 2017-06-05 ENCOUNTER — Encounter

## 2017-07-18 DIAGNOSIS — H6123 Impacted cerumen, bilateral: Secondary | ICD-10-CM | POA: Diagnosis not present

## 2017-07-18 DIAGNOSIS — H903 Sensorineural hearing loss, bilateral: Secondary | ICD-10-CM | POA: Diagnosis not present

## 2017-07-31 DIAGNOSIS — H3530 Unspecified macular degeneration: Secondary | ICD-10-CM | POA: Diagnosis not present

## 2017-07-31 DIAGNOSIS — G301 Alzheimer's disease with late onset: Secondary | ICD-10-CM | POA: Diagnosis not present

## 2017-07-31 DIAGNOSIS — F0280 Dementia in other diseases classified elsewhere without behavioral disturbance: Secondary | ICD-10-CM | POA: Diagnosis not present

## 2017-09-25 ENCOUNTER — Other Ambulatory Visit: Payer: Self-pay

## 2017-09-25 ENCOUNTER — Encounter: Payer: Self-pay | Admitting: Emergency Medicine

## 2017-09-25 ENCOUNTER — Emergency Department
Admission: EM | Admit: 2017-09-25 | Discharge: 2017-09-25 | Disposition: A | Discharge status: Home / Self Care | Payer: PPO | Attending: Emergency Medicine | Admitting: Emergency Medicine

## 2017-09-25 DIAGNOSIS — R112 Nausea with vomiting, unspecified: Secondary | ICD-10-CM | POA: Insufficient documentation

## 2017-09-25 DIAGNOSIS — Z853 Personal history of malignant neoplasm of breast: Secondary | ICD-10-CM | POA: Insufficient documentation

## 2017-09-25 DIAGNOSIS — R1110 Vomiting, unspecified: Secondary | ICD-10-CM | POA: Diagnosis not present

## 2017-09-25 DIAGNOSIS — J449 Chronic obstructive pulmonary disease, unspecified: Secondary | ICD-10-CM | POA: Diagnosis not present

## 2017-09-25 DIAGNOSIS — Z9010 Acquired absence of unspecified breast and nipple: Secondary | ICD-10-CM | POA: Insufficient documentation

## 2017-09-25 DIAGNOSIS — F0390 Unspecified dementia without behavioral disturbance: Secondary | ICD-10-CM | POA: Diagnosis not present

## 2017-09-25 DIAGNOSIS — R1111 Vomiting without nausea: Secondary | ICD-10-CM

## 2017-09-25 HISTORY — DX: Unspecified dementia, unspecified severity, without behavioral disturbance, psychotic disturbance, mood disturbance, and anxiety: F03.90

## 2017-09-25 MED ORDER — ONDANSETRON HCL 4 MG/5ML PO SOLN
2.0000 mg | Freq: Once | ORAL | Status: DC
  Filled 2017-09-25: qty 2.5

## 2017-09-25 NOTE — ED Triage Notes (Signed)
Here for nausea and vomiting today. Denies any pain. Has dementia. Cannot obtain BP as pt will not tolerate and keeps moving and screaming to take it off. NAD

## 2017-09-25 NOTE — ED Notes (Signed)
Remain unable to obtain BP. Family does not want RN to try again.  Dr Burlene Arnt aware.  Pt given sprite for PO challenge.

## 2017-09-25 NOTE — ED Provider Notes (Addendum)
[facility] Emergency Department Provider Note  ____________________________________________   I have reviewed the triage vital signs and the nursing notes. Where available I have reviewed prior notes and, if possible and indicated, outside hospital notes.    HISTORY  Chief Complaint Emesis    HPI Brandi Nelson is a 84 y.o. female  Who presents today complaining only of being here.  According to family she vomited x2 nonbloody nonbilious and is been having to far as a no normal bowel movements.  Patient takes no medications and is quite spry but does suffer from dementia.  Patient refuses IV refuses blood pressure and states that over and over that she just wants to go home.  She is 89.  She denies any pain.  Level 5 chart caveat; no further history available due to patient status.     Past Medical History:  Diagnosis Date  . Cancer ([place])    breast  . Dementia   . Hearing loss   . Macular degeneration     Patient Active Problem List   Diagnosis Date Noted  . Dehydration 11/25/2016  . Rhabdomyolysis 11/25/2016  . Respiratory failure ([place]) 11/29/2015  . COPD exacerbation ([place]) 11/29/2015  . Chest pain 08/30/2014  . Elevated troponin 08/30/2014    Past Surgical History:  Procedure Laterality Date  . MASTECTOMY      Prior to Admission medications   Not on File    Allergies Penicillin g  Family History  Family history unknown: Yes    Social History Social History   Tobacco Use  . Smoking status: Never Smoker  . Smokeless tobacco: Never Used  Substance Use Topics  . Alcohol use: No  . Drug use: Not on file    Review of Systems Constitutional: No fever/chills Eyes: No visual changes. ENT: No sore throat. No stiff neck no neck pain Cardiovascular: Denies chest pain. Respiratory: Denies shortness of breath. Gastrointestinal:   no vomiting.  No diarrhea.  No constipation. Genitourinary: Negative for dysuria. Musculoskeletal:  Negative lower extremity swelling Skin: Negative for rash. Neurological: Negative for severe headaches, focal weakness or numbness.   ____________________________________________   PHYSICAL EXAM:  VITAL SIGNS: ED Triage Vitals  Enc Vitals Group     BP --      Pulse Rate 09/25/17 2030 (!) 109     Resp 09/25/17 2030 20     Temp 09/25/17 2030 98.7 F (37.1 C)     Temp Source 09/25/17 2030 Oral     SpO2 09/25/17 2030 98 %     Weight 09/25/17 2024 130 lb (59 kg)     Height --      Head Circumference --      Peak Flow --      Pain Score 09/25/17 2024 0     Pain Loc --      Pain Edu? --      Excl. in [place]? --     Constitutional: Alert and oriented to name and place unsure of the date.  Markedly well-appearing for a patient her age,  Eyes: Conjunctivae are normal Head: Atraumatic HEENT: No congestion/rhinnorhea. Mucous membranes are moist.  Oropharynx non-erythematous Neck:   Nontender with no meningismus, no masses, no stridor Cardiovascular: Normal rate, regular rhythm. Grossly normal heart sounds.  Good peripheral circulation. Respiratory: Normal respiratory effort.  No retractions. Lungs CTAB. Abdominal: Soft and nontender. No distention. No guarding no rebound Back:  There is no focal tenderness or step off.  there is no  midline tenderness there are no lesions noted. there is no CVA tenderness Musculoskeletal: No lower extremity tenderness, no upper extremity tenderness. No joint effusions, no DVT signs strong distal pulses no edema Neurologic:  Normal speech and language. No gross focal neurologic deficits are appreciated.  Skin:  Skin is warm, dry and intact. No rash noted. Psychiatric: Mood and affect are normal. Speech and behavior are normal.  ____________________________________________   LABS (all labs ordered are listed, but only abnormal results are displayed)  Labs Reviewed - No data to display  Pertinent labs  results that were available during my care of the  patient were reviewed by me and considered in my medical decision making (see chart for details). ____________________________________________  EKG  I personally interpreted any EKGs ordered by me or triage  ____________________________________________  RADIOLOGY  Pertinent labs & imaging results that were available during my care of the patient were reviewed by me and considered in my medical decision making (see chart for details). If possible, patient and/or family made aware of any abnormal findings.  No results found. ____________________________________________    PROCEDURES  Procedure(s) performed: None  Procedures  Critical Care performed: None  ____________________________________________   INITIAL IMPRESSION / ASSESSMENT AND PLAN / ED COURSE  Pertinent labs & imaging results that were available during my care of the patient were reviewed by me and considered in my medical decision making (see chart for details).  Patient here she is angry and upset about being here.  I have talked to family about our goals of care for this 84 year old lady.  She is adamant that she does not want a blood pressure cuff she does not want an IV and she just wants to go home.  She is not exhibiting any evidence of pain.  Patient denies pain.  I have explained to the family, that are capable of compelling her to have blood work if they insist however it is not clear that putting her through that against her clearly expressed wishes at this time is going to be in her best interest.  Patient would prefer to die rather than have all this medical intervention, and any general 89 I am reluctant to put her through anything like that.  She does suffer from some degree of dementia, so of course the family is involved in this conversation.  They themselves and understand that at this time she could looks quite well and they would prefer not to have anything done.  They understand that this means that  she could possibly die from what ever is going on however it is her strong preference not to have all this medical testing.  They do agree to oral Zofran and p.o. challenges to ensure that she is able to take p.o. prior to discharge and I do not think that is unreasonable.  In a patient this age without blood work and extensive work-up there is no way to guarantee that nothing else is going on and is certainly possible she has a significant medical problem.  However, I do not think that she would ever qualify for surgery and I do not think she will tolerate admission or IV.  Patient is adamant that she wishes only to go home.  I think at age 73 our best goal should be to honor these wishes as much as possible  ----------------------------------------- 8:57 PM on 09/25/2017 ----------------------------------------- He agrees with not getting a blood pressure check on the patient they do not want any IV they do  not want any medications either, we will see if she can tolerate p.o. and if she can we will discharge her.  She seems "fine to them" but they understand that her age certainly any day could be her last, and they are very comfortable taking her home    ____________________________________________   FINAL CLINICAL IMPRESSION(S) / ED DIAGNOSES  Final diagnoses:  None      This chart was dictated using voice recognition software.  Despite best efforts to proofread,  errors can occur which can change meaning.      Schuyler Amor, MD 09/25/17 2050    Schuyler Amor, MD 09/25/17 2058    Schuyler Amor, MD 09/25/17 2114

## 2017-09-25 NOTE — ED Notes (Signed)
Family does not want IV started or blood drawn.  No vomiting in ED

## 2017-09-25 NOTE — Discharge Instructions (Addendum)
Return to the emergency room for any new or worrisome symptoms, as we discussed, we cannot without IVs and other things that she does not want to tell you for sure that she is okay so if you are very worried about her you wish [place] intervene or do other things like IVs etc. that you do not wish her to do right now, please bring her back.  Keep her hydrated and be vigilant about her health.  Follow with her doctor tomorrow without fail.

## 2017-09-27 ENCOUNTER — Other Ambulatory Visit: Payer: Self-pay

## 2017-09-27 NOTE — Patient Outreach (Signed)
[facility]) Care Management  09/27/2017  Brandi Nelson 25-Apr-1907 [id number]   Telephone Screen  Referral Date: 09/27/17 Referral Source: [facility] Dept. Referral Reason: "patient takes no medications and is quite spry but does suffer from dementia, member HOH, lives alone but sometimes daughter visits and spends the night, daughter lives out of town" Insurance: [employer]   Outreach attempt #1 to patient.Spoke with patient who was very [place] on the phone. She was a&ox4 and able to answer questions. She did not want to talk long as she states that she is waiting on her family to pick her up to take her out to eat. Patient confirms that she lives alone but has some family that lives nearby. She voiced that her son checks on her often. She is still fairly independent. Family takes patient to all MD appts. She denies any recent falls. Patient not taking any meds at this time. Discussed [facility] referral and services. Patient a little "bothered" by referral. She states that she able to live alone and get around well for her age. She denies needing any further RN CM assistance or [facility] needs at this time. Patient then rushed RN CM off the phone and ended call.      Plan: RN CM will close case at this time. RN CM will send [facility] letter along with brochure to patient via mail.   Enzo Montgomery, RN,BSN,CCM [place] Management Telephonic Care Management Coordinator Direct Phone: [phone] Toll Free: [phone] Fax: [phone]

## 2017-10-01 ENCOUNTER — Inpatient Hospital Stay
Admission: EM | Admit: 2017-10-01 | Discharge: 2017-10-04 | DRG: 444 | Disposition: A | Discharge status: Home Health | Payer: PPO | Attending: Internal Medicine | Admitting: Internal Medicine

## 2017-10-01 ENCOUNTER — Inpatient Hospital Stay: Payer: PPO

## 2017-10-01 ENCOUNTER — Encounter: Payer: Self-pay | Admitting: Emergency Medicine

## 2017-10-01 ENCOUNTER — Other Ambulatory Visit: Payer: Self-pay

## 2017-10-01 ENCOUNTER — Emergency Department: Payer: PPO

## 2017-10-01 DIAGNOSIS — R945 Abnormal results of liver function studies: Secondary | ICD-10-CM | POA: Diagnosis not present

## 2017-10-01 DIAGNOSIS — R935 Abnormal findings on diagnostic imaging of other abdominal regions, including retroperitoneum: Secondary | ICD-10-CM | POA: Diagnosis not present

## 2017-10-01 DIAGNOSIS — Z66 Do not resuscitate: Secondary | ICD-10-CM | POA: Diagnosis not present

## 2017-10-01 DIAGNOSIS — Z9010 Acquired absence of unspecified breast and nipple: Secondary | ICD-10-CM | POA: Diagnosis not present

## 2017-10-01 DIAGNOSIS — K829 Disease of gallbladder, unspecified: Secondary | ICD-10-CM

## 2017-10-01 DIAGNOSIS — Z8249 Family history of ischemic heart disease and other diseases of the circulatory system: Secondary | ICD-10-CM | POA: Diagnosis not present

## 2017-10-01 DIAGNOSIS — R531 Weakness: Secondary | ICD-10-CM | POA: Diagnosis not present

## 2017-10-01 DIAGNOSIS — H3530 Unspecified macular degeneration: Secondary | ICD-10-CM | POA: Diagnosis not present

## 2017-10-01 DIAGNOSIS — Z880 Allergy status to penicillin: Secondary | ICD-10-CM

## 2017-10-01 DIAGNOSIS — J449 Chronic obstructive pulmonary disease, unspecified: Secondary | ICD-10-CM | POA: Diagnosis present

## 2017-10-01 DIAGNOSIS — R0602 Shortness of breath: Secondary | ICD-10-CM | POA: Diagnosis not present

## 2017-10-01 DIAGNOSIS — K7689 Other specified diseases of liver: Secondary | ICD-10-CM | POA: Diagnosis not present

## 2017-10-01 DIAGNOSIS — K8020 Calculus of gallbladder without cholecystitis without obstruction: Secondary | ICD-10-CM | POA: Diagnosis not present

## 2017-10-01 DIAGNOSIS — H548 Legal blindness, as defined in USA: Secondary | ICD-10-CM | POA: Diagnosis not present

## 2017-10-01 DIAGNOSIS — K8000 Calculus of gallbladder with acute cholecystitis without obstruction: Secondary | ICD-10-CM | POA: Diagnosis not present

## 2017-10-01 DIAGNOSIS — K851 Biliary acute pancreatitis without necrosis or infection: Secondary | ICD-10-CM

## 2017-10-01 DIAGNOSIS — F0390 Unspecified dementia without behavioral disturbance: Secondary | ICD-10-CM | POA: Diagnosis present

## 2017-10-01 DIAGNOSIS — R079 Chest pain, unspecified: Secondary | ICD-10-CM | POA: Diagnosis not present

## 2017-10-01 DIAGNOSIS — R7989 Other specified abnormal findings of blood chemistry: Secondary | ICD-10-CM

## 2017-10-01 DIAGNOSIS — K5750 Diverticulosis of both small and large intestine without perforation or abscess without bleeding: Secondary | ICD-10-CM | POA: Diagnosis not present

## 2017-10-01 DIAGNOSIS — E860 Dehydration: Secondary | ICD-10-CM | POA: Diagnosis present

## 2017-10-01 DIAGNOSIS — H9190 Unspecified hearing loss, unspecified ear: Secondary | ICD-10-CM | POA: Diagnosis not present

## 2017-10-01 DIAGNOSIS — Z515 Encounter for palliative care: Secondary | ICD-10-CM

## 2017-10-01 DIAGNOSIS — K8010 Calculus of gallbladder with chronic cholecystitis without obstruction: Secondary | ICD-10-CM | POA: Diagnosis not present

## 2017-10-01 DIAGNOSIS — K8011 Calculus of gallbladder with chronic cholecystitis with obstruction: Secondary | ICD-10-CM | POA: Diagnosis not present

## 2017-10-01 HISTORY — DX: Unspecified asthma, uncomplicated: J45.909

## 2017-10-01 LAB — CBC
HEMATOCRIT: 35.2 % (ref 35.0–47.0)
HEMOGLOBIN: 12.1 g/dL (ref 12.0–16.0)
MCH: 31 pg (ref 26.0–34.0)
MCHC: 34.4 g/dL (ref 32.0–36.0)
MCV: 90.2 fL (ref 80.0–100.0)
Platelets: 269 10*3/uL (ref 150–440)
RBC: 3.91 MIL/uL (ref 3.80–5.20)
RDW: 15.3 % — ABNORMAL HIGH (ref 11.5–14.5)
WBC: 10.6 10*3/uL (ref 3.6–11.0)

## 2017-10-01 LAB — BASIC METABOLIC PANEL
ANION GAP: 8 (ref 5–15)
BUN: 23 mg/dL (ref 8–23)
CALCIUM: 8.7 mg/dL — AB (ref 8.9–10.3)
CHLORIDE: 110 mmol/L (ref 98–111)
CO2: 23 mmol/L (ref 22–32)
Creatinine, Ser: 0.94 mg/dL (ref 0.44–1.00)
GFR calc non Af Amer: 46 mL/min — ABNORMAL LOW (ref >60)
GFR, EST AFRICAN AMERICAN: 53 mL/min — AB (ref >60)
Glucose, Bld: 154 mg/dL — ABNORMAL HIGH (ref 70–99)
POTASSIUM: 3.9 mmol/L (ref 3.5–5.1)
Sodium: 141 mmol/L (ref 135–145)

## 2017-10-01 LAB — URINALYSIS, COMPLETE (UACMP) WITH MICROSCOPIC
Bilirubin Urine: NEGATIVE
GLUCOSE, UA: NEGATIVE mg/dL
Hgb urine dipstick: NEGATIVE
Ketones, ur: NEGATIVE mg/dL
Leukocytes, UA: NEGATIVE
NITRITE: NEGATIVE
PH: 5 (ref 5.0–8.0)
Protein, ur: 30 mg/dL — AB
SPECIFIC GRAVITY, URINE: 1.02 (ref 1.005–1.030)

## 2017-10-01 LAB — TSH: TSH: 2.931 u[IU]/mL (ref 0.350–4.500)

## 2017-10-01 LAB — HEPATIC FUNCTION PANEL
ALBUMIN: 4 g/dL (ref 3.5–5.0)
ALT: 149 U/L — ABNORMAL HIGH (ref 0–44)
AST: 231 U/L — ABNORMAL HIGH (ref 15–41)
Alkaline Phosphatase: 372 U/L — ABNORMAL HIGH (ref 38–126)
BILIRUBIN DIRECT: 1.7 mg/dL — AB (ref 0.0–0.2)
BILIRUBIN INDIRECT: 0.8 mg/dL (ref 0.3–0.9)
BILIRUBIN TOTAL: 2.5 mg/dL — AB (ref 0.3–1.2)
Total Protein: 7.1 g/dL (ref 6.5–8.1)

## 2017-10-01 LAB — TROPONIN I

## 2017-10-01 MED ORDER — SODIUM CHLORIDE 0.9 % IV SOLN
500.0000 mg | Freq: Two times a day (BID) | INTRAVENOUS | Status: DC
  Administered 2017-10-02 – 2017-10-03 (×3): 500 mg via INTRAVENOUS
  Filled 2017-10-01: qty 0.5
  Filled 2017-10-01 (×3): qty 500

## 2017-10-01 MED ORDER — LORAZEPAM 2 MG/ML IJ SOLN
INTRAMUSCULAR | Status: AC
  Filled 2017-10-01: qty 1

## 2017-10-01 MED ORDER — LORAZEPAM 2 MG/ML IJ SOLN
0.5000 mg | Freq: Once | INTRAMUSCULAR | Status: AC
  Administered 2017-10-01: 0.5 mg via INTRAVENOUS

## 2017-10-01 MED ORDER — PIPERACILLIN-TAZOBACTAM 3.375 G IVPB 30 MIN
3.3750 g | Freq: Once | INTRAVENOUS | Status: AC
  Administered 2017-10-01: 3.375 g via INTRAVENOUS
  Filled 2017-10-01: qty 50

## 2017-10-01 MED ORDER — SODIUM CHLORIDE 0.9 % IV BOLUS
500.0000 mL | Freq: Once | INTRAVENOUS | Status: AC
  Administered 2017-10-01: 500 mL via INTRAVENOUS

## 2017-10-01 MED ORDER — QUETIAPINE FUMARATE 25 MG PO TABS
12.5000 mg | ORAL_TABLET | Freq: Once | ORAL | Status: DC
  Filled 2017-10-01: qty 1

## 2017-10-01 NOTE — ED Notes (Signed)
ED Provider at bedside. 

## 2017-10-01 NOTE — H&P (Signed)
[facility] NAME: Brandi Nelson    MR#:  [id number]  DATE OF BIRTH:  1907-03-08  DATE OF ADMISSION:  10/01/2017  PRIMARY CARE PHYSICIAN: Baxter Hire, MD   REQUESTING/REFERRING PHYSICIAN: Mariea Clonts, MD  CHIEF COMPLAINT:   Chief Complaint  Patient presents with  . Chest Pain  . Shortness of Breath    HISTORY OF PRESENT ILLNESS:  Brandi Nelson  is a 84 y.o. female who presents with chief complaint as above.  Patient presents with epigastric pain, nausea and vomiting.  His symptoms started couple of days ago.  Here in the ED she was found to have elevated transaminases.  On imaging she was found to have thickened gallbladder wall with gallstones and sludge and dilated CBD.  Hospitalist were called for admission  PAST MEDICAL HISTORY:   Past Medical History:  Diagnosis Date  . Asthma   . Cancer ([place])    breast  . Dementia   . Hearing loss   . Macular degeneration      PAST SURGICAL HISTORY:   Past Surgical History:  Procedure Laterality Date  . MASTECTOMY       SOCIAL HISTORY:   Social History   Tobacco Use  . Smoking status: Never Smoker  . Smokeless tobacco: Never Used  Substance Use Topics  . Alcohol use: No     FAMILY HISTORY:   Family History  Problem Relation Age of Onset  . Heart attack Mother   . Heart attack Father      DRUG ALLERGIES:   Allergies  Allergen Reactions  . Penicillin G     Has patient had a PCN reaction causing immediate rash, facial/tongue/throat swelling, SOB or lightheadedness with hypotension: no  Has patient had a PCN reaction causing severe rash involving mucus membranes or skin necrosis: no Has patient had a PCN reaction that required hospitalization no Has patient had a PCN reaction occurring within the last 10 years: no If all of the above answers are "NO", then may proceed with Cephalosporin use.     MEDICATIONS AT HOME:   Prior to Admission  medications   Not on File    REVIEW OF SYSTEMS:  Review of Systems  Constitutional: Negative for chills, fever, malaise/fatigue and weight loss.  HENT: Negative for ear pain, hearing loss and tinnitus.   Eyes: Negative for blurred vision, double vision, pain and redness.  Respiratory: Negative for cough, hemoptysis and shortness of breath.   Cardiovascular: Negative for chest pain, palpitations, orthopnea and leg swelling.  Gastrointestinal: Positive for abdominal pain, nausea and vomiting. Negative for constipation and diarrhea.  Genitourinary: Negative for dysuria, frequency and hematuria.  Musculoskeletal: Negative for back pain, joint pain and neck pain.  Skin:       No acne, rash, or lesions  Neurological: Negative for dizziness, tremors, focal weakness and weakness.  Endo/Heme/Allergies: Negative for polydipsia. Does not bruise/bleed easily.  Psychiatric/Behavioral: Negative for depression. The patient is not nervous/anxious and does not have insomnia.      VITAL SIGNS:   Vitals:   10/01/17 1715 10/01/17 1730 10/01/17 1745 10/01/17 1830  BP:      Pulse: 89 (!) 41 99 (!) 109  Resp: (!) 27 (!) 23 (!) 26 (!) 27  SpO2: 95% (!) 80% 96% (!) 84%  Weight:       Wt Readings from Last 3 Encounters:  10/01/17 58.9 kg  09/25/17 59 kg  11/25/16 60.4 kg  PHYSICAL EXAMINATION:  Physical Exam  Vitals reviewed. Constitutional: She appears well-developed and well-nourished. No distress.  HENT:  Head: Normocephalic and atraumatic.  Mouth/Throat: Oropharynx is clear and moist.  Eyes: Pupils are equal, round, and reactive to light. Conjunctivae and EOM are normal. No scleral icterus.  Neck: Normal range of motion. Neck supple. No JVD present. No thyromegaly present.  Cardiovascular: Normal rate, regular rhythm and intact distal pulses. Exam reveals no gallop and no friction rub.  No murmur heard. Respiratory: Effort normal and breath sounds normal. No respiratory distress. She has  no wheezes. She has no rales.  GI: Soft. Bowel sounds are normal. She exhibits no distension. There is tenderness.  Musculoskeletal: Normal range of motion. She exhibits no edema.  No arthritis, no gout  Lymphadenopathy:    She has no cervical adenopathy.  Neurological: She is alert. No cranial nerve deficit.  No dysarthria, no aphasia, patient is oriented to self  Skin: Skin is warm and dry. No rash noted. No erythema.  Psychiatric:  Unable to fully assess due to dementia    LABORATORY PANEL:   CBC Recent Labs  Lab 10/01/17 1334  WBC 10.6  HGB 12.1  HCT 35.2  PLT 269   ------------------------------------------------------------------------------------------------------------------  Chemistries  Recent Labs  Lab 10/01/17 1334 10/01/17 1710  NA 141  --   K 3.9  --   CL 110  --   CO2 23  --   GLUCOSE 154*  --   BUN 23  --   CREATININE 0.94  --   CALCIUM 8.7*  --   AST  --  231*  ALT  --  149*  ALKPHOS  --  372*  BILITOT  --  2.5*   ------------------------------------------------------------------------------------------------------------------  Cardiac Enzymes Recent Labs  Lab 10/01/17 1710  TROPONINI <0.03   ------------------------------------------------------------------------------------------------------------------  RADIOLOGY:  Dg Chest 2 View  Result Date: 10/01/2017 CLINICAL DATA:  Chest pain, shortness of breath EXAM: CHEST - 2 VIEW COMPARISON:  11/25/2016 FINDINGS: There is bilateral chronic interstitial thickening. There is no focal parenchymal opacity. There is no pleural effusion or pneumothorax. There is stable cardiomegaly. There is no acute osseous abnormality. There is an old healed left proximal humeral fracture. There is degenerative disc disease of the thoracic spine. IMPRESSION: No active cardiopulmonary disease. Electronically Signed   By: Kathreen Devoid   On: 10/01/2017 13:53   US Abdomen Limited Ruq  Result Date: 10/01/2017 CLINICAL  DATA:  Abnormal LFTs. EXAM: ULTRASOUND ABDOMEN LIMITED RIGHT UPPER QUADRANT COMPARISON:  None. FINDINGS: Gallbladder: Moderate cholelithiasis with sludge and moderate gallbladder wall thickening measuring up to 1.2 cm. Negative sonographic Murphy sign. Largest stone measures 7 mm. No adjacent free fluid. Common bile duct: Diameter: 9 mm. Liver: Suggestion of echogenic portal triads over the left lobe without focal mass. Portal vein is patent on color Doppler imaging with normal direction of blood flow towards the liver. IMPRESSION: Moderate cholelithiasis with sludge and moderate gallbladder wall thickening. Upper normal diameter of the common bile duct measuring 9 mm. Findings may be seen with acute cholecystitis. Mild increased echogenicity of the portal triads over the left lobe of the liver which can be seen with hepatitis. Electronically Signed   By: Marin Olp M.D.   On: 10/01/2017 19:39    EKG:   Orders placed or performed during the hospital encounter of 10/01/17  . EKG 12-Lead  . EKG 12-Lead  . ED EKG within 10 minutes  . ED EKG within 10 minutes  IMPRESSION AND PLAN:  Principal Problem:   Cholecystitis with cholelithiasis -IV antibiotics, GI consult, MRCP ordered.  Depending on results of MRCP patient may need ERCP, defer to GI recommendation for this. Active Problems:   Dehydration -IV fluids for hydration   Dementia -patient seems to be on donepezil at home per chart review, we will order this when she is taking p.o.  Chart review performed and case discussed with ED provider. Labs, imaging and/or ECG reviewed by provider and discussed with patient/family. Management plans discussed with the patient and/or family.  DVT PROPHYLAXIS: SubQ lovenox   GI PROPHYLAXIS:  None  ADMISSION STATUS: Inpatient     CODE STATUS: DNR Code Status History    Date Active Date Inactive Code Status Order ID Comments User Context   11/25/2016 1243 11/26/2016 1612 DNR [id number]  Idelle Crouch, MD Inpatient   11/29/2015 2210 12/01/2015 1706 Full Code [id number]  Gladstone Lighter, MD Inpatient   08/30/2014 1814 08/31/2014 2011 DNR [id number]  Idelle Crouch, MD Inpatient    Questions for Most Recent Historical Code Status (Order [id number])    Question Answer Comment   In the event of cardiac or respiratory ARREST Do not call a "code blue"    In the event of cardiac or respiratory ARREST Do not perform Intubation, CPR, defibrillation or ACLS    In the event of cardiac or respiratory ARREST Use medication by any route, position, wound care, and other measures to relive pain and suffering. May use oxygen, suction and manual treatment of airway obstruction as needed for comfort.       TOTAL TIME TAKING CARE OF THIS PATIENT: 45 minutes.   Johnross Nabozny Whitman 10/01/2017, 9:04 PM  [facility]  Office  [phone]  CC: Primary care physician; Baxter Hire, MD  Note:  This document was prepared using Dragon voice recognition software and may include unintentional dictation errors.

## 2017-10-01 NOTE — ED Notes (Signed)
Report off to brittnay rn

## 2017-10-01 NOTE — ED Triage Notes (Signed)
Chest pain and short of breath staarted today.  ? abd as well, but she is holding her chest.

## 2017-10-01 NOTE — ED Provider Notes (Signed)
[facility] Emergency Department Provider Note  ____________________________________________  Time seen: Approximately 5:01 PM  I have reviewed the triage vital signs and the nursing notes.   HISTORY  Chief Complaint Chest Pain and Shortness of Breath    HPI Brandi Nelson is a 84 y.o. female with a history of dementia, macular degeneration and almost complete blindness, presenting for chest pain and shortness of breath.  The patient is unable to give most of the history.  At baseline, she lives independently with family members who check on her throughout the day, and walks without any assistance; she is alert and oriented except to time.  She is accompanied by her son, who reports that she had 2 days of nausea and vomiting last week, which improved in the emergency department.  She returned home in improved condition, but has never completely had a full return of her appetite.  In addition, she intermittently has told her son that she is having a central chest pain and shortness of breath.  He reports that this morning, her chest pain lasted 2 to 3 hours and resolved while in the waiting room.  These episodes can occur at any time, and several minutes after they occur, the patient has forgotten about them.  They are not associated with food or exertion.  She has not had any lower extremity swelling or calf pain.  She has not had any cough or cold symptoms, abdominal pain, and the son does not know whether she has had constipation or diarrhea.  Past Medical History:  Diagnosis Date  . Cancer ([place])    breast  . Dementia   . Hearing loss   . Macular degeneration     Patient Active Problem List   Diagnosis Date Noted  . Dehydration 11/25/2016  . Rhabdomyolysis 11/25/2016  . Respiratory failure ([place]) 11/29/2015  . COPD exacerbation ([place]) 11/29/2015  . Chest pain 08/30/2014  . Elevated troponin 08/30/2014    Past Surgical History:  Procedure Laterality Date   . MASTECTOMY        Allergies Penicillin g  Family History  Family history unknown: Yes    Social History Social History   Tobacco Use  . Smoking status: Never Smoker  . Smokeless tobacco: Never Used  Substance Use Topics  . Alcohol use: No  . Drug use: Not on file    Review of Systems Constitutional: No fever/chills.  No lightheadedness or syncope.  No known falls. Eyes: Macular degeneration with legal blindness, unchanged. ENT: No sore throat. No congestion or rhinorrhea.  No ear pain. Cardiovascular: Positive chest pain. Denies palpitations. Respiratory: Positive shortness of breath.  No cough. Gastrointestinal: No abdominal pain.  No nausea, no vomiting.  No diarrhea.  No constipation. Genitourinary: Negative for dysuria. Skin: Negative for rash. Neurological: Negative for headaches. No focal numbness, tingling or weakness.  No difficulty walking.    ____________________________________________   PHYSICAL EXAM:  VITAL SIGNS: ED Triage Vitals  Enc Vitals Group     BP 10/01/17 1309 115/76     Pulse Rate 10/01/17 1309 61     Resp 10/01/17 1309 20     Temp --      Temp src --      SpO2 10/01/17 1309 98 %     Weight 10/01/17 1312 129 lb 13.6 oz (58.9 kg)     Height --      Head Circumference --      Peak Flow --  Pain Score --      Pain Loc --      Pain Edu? --      Excl. in [place]? --     Constitutional: The patient is alert, and able to answer some questions; she is able to follow basic commands.  GCS is 15. Eyes: Conjunctivae are normal.  EOMI. No scleral icterus.  No eye discharge. Head: Atraumatic. Nose: No congestion/rhinnorhea. Mouth/Throat: Mucous membranes are dry.  Neck: No stridor.  Supple.  No JVD.  No meningismus. Cardiovascular: Normal rate, regular rhythm. No murmurs, rubs or gallops.  Respiratory: Normal respiratory effort.  No accessory muscle use or retractions. Lungs CTAB.  No wheezes, rales or ronchi. Gastrointestinal: Soft,  nontender and mildly distended.  No guarding or rebound.  No peritoneal signs. Musculoskeletal: No LE edema.  Neurologic:  A&Ox2.  Speech is clear.  Face and smile are symmetric.  EOMI.  Moves all extremities well. Skin:  Skin is warm, dry and intact. No rash noted. Psychiatric: Mood and affect are normal.   ____________________________________________   LABS (all labs ordered are listed, but only abnormal results are displayed)  Labs Reviewed  BASIC METABOLIC PANEL - Abnormal; Notable for the following components:      Result Value   Glucose, Bld 154 (*)    Calcium 8.7 (*)    GFR calc non Af Amer 46 (*)    GFR calc Af Amer 53 (*)    All other components within normal limits  CBC - Abnormal; Notable for the following components:   RDW 15.3 (*)    All other components within normal limits  HEPATIC FUNCTION PANEL - Abnormal; Notable for the following components:   AST 231 (*)    ALT 149 (*)    Alkaline Phosphatase 372 (*)    Total Bilirubin 2.5 (*)    Bilirubin, Direct 1.7 (*)    All other components within normal limits  URINALYSIS, COMPLETE (UACMP) WITH MICROSCOPIC - Abnormal; Notable for the following components:   Color, Urine AMBER (*)    APPearance HAZY (*)    Protein, ur 30 (*)    Bacteria, UA MANY (*)    All other components within normal limits  TROPONIN I  TROPONIN I  TSH   ____________________________________________  EKG  ED ECG REPORT I, Anne-Caroline Mariea Clonts, the attending physician, personally viewed and interpreted this ECG.   Date: 10/01/2017  EKG Time: 1303  Rate: 61  Rhythm: normal sinus rhythm; RBBB  Axis: leftward  Intervals:first degree block  ST&T Change: no STEMI  This EKG is compared to 05/03/2016 and is grossly unchanged in morphology.  ____________________________________________  RADIOLOGY  Dg Chest 2 View  Result Date: 10/01/2017 CLINICAL DATA:  Chest pain, shortness of breath EXAM: CHEST - 2 VIEW COMPARISON:  11/25/2016 FINDINGS:  There is bilateral chronic interstitial thickening. There is no focal parenchymal opacity. There is no pleural effusion or pneumothorax. There is stable cardiomegaly. There is no acute osseous abnormality. There is an old healed left proximal humeral fracture. There is degenerative disc disease of the thoracic spine. IMPRESSION: No active cardiopulmonary disease. Electronically Signed   By: Kathreen Devoid   On: 10/01/2017 13:53   US Abdomen Limited Ruq  Result Date: 10/01/2017 CLINICAL DATA:  Abnormal LFTs. EXAM: ULTRASOUND ABDOMEN LIMITED RIGHT UPPER QUADRANT COMPARISON:  None. FINDINGS: Gallbladder: Moderate cholelithiasis with sludge and moderate gallbladder wall thickening measuring up to 1.2 cm. Negative sonographic Murphy sign. Largest stone measures 7 mm. No adjacent free fluid. Common  bile duct: Diameter: 9 mm. Liver: Suggestion of echogenic portal triads over the left lobe without focal mass. Portal vein is patent on color Doppler imaging with normal direction of blood flow towards the liver. IMPRESSION: Moderate cholelithiasis with sludge and moderate gallbladder wall thickening. Upper normal diameter of the common bile duct measuring 9 mm. Findings may be seen with acute cholecystitis. Mild increased echogenicity of the portal triads over the left lobe of the liver which can be seen with hepatitis. Electronically Signed   By: Marin Olp M.D.   On: 10/01/2017 19:39    ____________________________________________   PROCEDURES  Procedure(s) performed: None  Procedures  Critical Care performed: No ____________________________________________   INITIAL IMPRESSION / ASSESSMENT AND PLAN / ED COURSE  Pertinent labs & imaging results that were available during my care of the patient were reviewed by me and considered in my medical decision making (see chart for details).  84 y.o. female with dementia and macular degeneration presenting for chest pain and shortness of breath.  Overall,  the patient tells me that she is not experiencing any chest pain or shortness of breath at this time.  There are multiple etiologies for the patient's symptoms.  I do not see any ischemia on her EKG and her first troponin is negative; a second troponin is pending.  Her chest x-ray does not show any acute cardiopulmonary process and she certainly is not giving any history that would be suggestive of a pneumonia.  PE is possible, although she is an active person without tachycardia, hypoxia, or evidence of DVT; there is low likelihood of this and given her age and blindness, as well as recent fall, anticoagulation would not be indicated; imaging is not indicated today.  The patient is also had a loss of appetite, so a UA, hepatic function panel and thyroid panel are ordered and pending.  Plan reevaluation for final disposition.  ----------------------------------------- 6:17 PM on 10/01/2017 -----------------------------------------  The patient does have abnormal LFTs, which is normal for her.  These may be an acute bump from her vomiting illness, but I would also be concerned about gallbladder pathology so an ultrasound has been ordered.  I have reevaluated her abdomen and she continues to be soft and nontender with a negative Murphy sign.  In addition, will get a urine sample for further evaluation.  Plan reevaluation.  The patient second troponin is reassuring.  ----------------------------------------- 7:53 PM on 10/01/2017 -----------------------------------------  The patient's ultrasound does show gallbladder wall thickening with sludge and stones, and a CBD of 9 mm.  I have spoken with the general surgeon, Dr. Rosana Hoes, who recommends GI consultation.  I have spoken to Dr. Rogers Blocker, GI, who recommends MRCP and initiation of Zosyn.  Plan admission to the hospitalist for further evaluation and treatment.    ____________________________________________  FINAL CLINICAL IMPRESSION(S) / ED  DIAGNOSES  Final diagnoses:  Abnormal LFTs  Gallbladder disease         NEW MEDICATIONS STARTED DURING THIS VISIT:  New Prescriptions   No medications on file      Eula Listen, MD 10/01/17 2011

## 2017-10-01 NOTE — Progress Notes (Signed)
Pharmacy Antibiotic Note  Brandi Nelson is a 84 y.o. female admitted on 10/01/2017 with intra-abdominal infection.  Pharmacy has been consulted for meropenem dosing.  Plan:  Zosyn 3.375 gm IV X 1 given in ED on 9/23 @ 21:00. Meropenem 500 mg IV Q12H ordered to start on 9/24 @ 0300.   Height: 4\' 11"  (149.9 cm) Weight: 129 lb 13.6 oz (58.9 kg) IBW/kg (Calculated) : 43.2  No data recorded.  Recent Labs  Lab 10/01/17 1334  WBC 10.6  CREATININE 0.94    Estimated Creatinine Clearance: 20.5 mL/min (by C-G formula based on SCr of 0.94 mg/dL).    Allergies  Allergen Reactions  . Penicillin G     Has patient had a PCN reaction causing immediate rash, facial/tongue/throat swelling, SOB or lightheadedness with hypotension: no  Has patient had a PCN reaction causing severe rash involving mucus membranes or skin necrosis: no Has patient had a PCN reaction that required hospitalization no Has patient had a PCN reaction occurring within the last 10 years: no If all of the above answers are "NO", then may proceed with Cephalosporin use.     Antimicrobials this admission:   >>    >>   Dose adjustments this admission:   Microbiology results:  BCx:   UCx:    Sputum:    MRSA PCR:   Thank you for allowing pharmacy to be a part of this patient's care.  Lasundra Hascall D 10/01/2017 9:49 PM

## 2017-10-01 NOTE — Progress Notes (Addendum)
Patient discussed with Dr. Mariea Clonts. Patient's chart, labs, and ultrasound report were reviewed. For acutely abnormal LFT's with cholelithiasis and upper-normal diameter CBD, GI consultation is indicated with anticipated MRCP to evaluate for choledocholithiasis. Moderate gallbladder wall thickening may be associated with cholecystitis, though ED notes indicate no abdominal pain in the context of CP, SOB, and chronic dementia. GI consultation is appreciated. Agree with NPO, antibiotics for now. Repeat/trend morning LFT's. Full surgical consult to follow.  Please call if any questions or concerns.  -- Marilynne Drivers Rosana Hoes, MD, [place]: [facility] - Partnering for exceptional care. Office: [phone]

## 2017-10-01 NOTE — ED Notes (Signed)
Pt is going to MRI.

## 2017-10-01 NOTE — ED Notes (Signed)
Obtaining a BP on pt is difficult pt will moan and try and take cuff off because she stated that it hurts and it to tight.

## 2017-10-01 NOTE — ED Notes (Addendum)
Son states pt was c/o chest pain and sob around noon today.  Pt also reports feeling sick on her stomach.  Iv in place.  Sinus on monitor.  Pt will not leave blood pressure cuff on arm.    Pt lives at home alone.  Son with pt.

## 2017-10-02 DIAGNOSIS — K8011 Calculus of gallbladder with chronic cholecystitis with obstruction: Secondary | ICD-10-CM

## 2017-10-02 DIAGNOSIS — R945 Abnormal results of liver function studies: Secondary | ICD-10-CM

## 2017-10-02 LAB — COMPREHENSIVE METABOLIC PANEL
ALT: 171 U/L — AB (ref 0–44)
AST: 251 U/L — AB (ref 15–41)
Albumin: 2.9 g/dL — ABNORMAL LOW (ref 3.5–5.0)
Alkaline Phosphatase: 372 U/L — ABNORMAL HIGH (ref 38–126)
Anion gap: 7 (ref 5–15)
BILIRUBIN TOTAL: 2.5 mg/dL — AB (ref 0.3–1.2)
BUN: 20 mg/dL (ref 8–23)
CHLORIDE: 112 mmol/L — AB (ref 98–111)
CO2: 24 mmol/L (ref 22–32)
Calcium: 8.1 mg/dL — ABNORMAL LOW (ref 8.9–10.3)
Creatinine, Ser: 1.02 mg/dL — ABNORMAL HIGH (ref 0.44–1.00)
GFR calc Af Amer: 48 mL/min — ABNORMAL LOW (ref >60)
GFR, EST NON AFRICAN AMERICAN: 41 mL/min — AB (ref >60)
GLUCOSE: 112 mg/dL — AB (ref 70–99)
Potassium: 3.8 mmol/L (ref 3.5–5.1)
Sodium: 143 mmol/L (ref 135–145)
TOTAL PROTEIN: 5.8 g/dL — AB (ref 6.5–8.1)

## 2017-10-02 LAB — CBC
HEMATOCRIT: 33.9 % — AB (ref 35.0–47.0)
Hemoglobin: 11.7 g/dL — ABNORMAL LOW (ref 12.0–16.0)
MCH: 30.8 pg (ref 26.0–34.0)
MCHC: 34.4 g/dL (ref 32.0–36.0)
MCV: 89.4 fL (ref 80.0–100.0)
Platelets: 252 10*3/uL (ref 150–440)
RBC: 3.79 MIL/uL — AB (ref 3.80–5.20)
RDW: 15.1 % — ABNORMAL HIGH (ref 11.5–14.5)
WBC: 14.7 10*3/uL — AB (ref 3.6–11.0)

## 2017-10-02 LAB — LIPASE, BLOOD: LIPASE: 508 U/L — AB (ref 11–51)

## 2017-10-02 MED ORDER — TRAMADOL HCL 50 MG PO TABS: 50.0000 mg | ORAL_TABLET | Freq: Two times a day (BID) | ORAL | Status: DC | PRN

## 2017-10-02 MED ORDER — SODIUM CHLORIDE 0.9 % IV SOLN
INTRAVENOUS | Status: DC
  Administered 2017-10-02: 02:00:00 via INTRAVENOUS

## 2017-10-02 MED ORDER — ACETAMINOPHEN 325 MG PO TABS
650.0000 mg | ORAL_TABLET | Freq: Four times a day (QID) | ORAL | Status: DC | PRN
  Administered 2017-10-02: 650 mg via ORAL
  Filled 2017-10-02: qty 2

## 2017-10-02 MED ORDER — ACETAMINOPHEN 650 MG RE SUPP: 650.0000 mg | Freq: Four times a day (QID) | RECTAL | Status: DC | PRN

## 2017-10-02 MED ORDER — ENOXAPARIN SODIUM 40 MG/0.4ML ~~LOC~~ SOLN: 40.0000 mg | SUBCUTANEOUS | Status: DC

## 2017-10-02 MED ORDER — ONDANSETRON HCL 4 MG/2ML IJ SOLN: 4.0000 mg | Freq: Four times a day (QID) | INTRAMUSCULAR | Status: DC | PRN

## 2017-10-02 MED ORDER — HEPARIN SODIUM (PORCINE) 5000 UNIT/ML IJ SOLN
5000.0000 [IU] | Freq: Three times a day (TID) | INTRAMUSCULAR | Status: DC
  Administered 2017-10-02 – 2017-10-03 (×3): 5000 [IU] via SUBCUTANEOUS
  Filled 2017-10-02 (×5): qty 1

## 2017-10-02 MED ORDER — GADOBENATE DIMEGLUMINE 529 MG/ML IV SOLN
15.0000 mL | Freq: Once | INTRAVENOUS | Status: AC | PRN
  Administered 2017-10-02: 11 mL via INTRAVENOUS

## 2017-10-02 MED ORDER — ONDANSETRON HCL 4 MG PO TABS: 4.0000 mg | ORAL_TABLET | Freq: Four times a day (QID) | ORAL | Status: DC | PRN

## 2017-10-02 NOTE — Consult Note (Addendum)
SURGICAL CONSULTATION NOTE (initial) - cpt: 45809  Patient seen and examined as described below with surgical PA-C, Ardell Isaacs.  Assessment/Plan: (ICD-10's: K85.10, K80.11) In summary, patient is a 84 y.o. 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   - monitor abdominal exam  - continue therapeutic antibiotics  - repeat/trend morning bilirubin (LFT's/CMP), lipase, and WBC   - unlikely to be a surgical candidate considering age and chronic dementia with medical risk stratification and optimization clearly required prior to any consideration for surgery (again, not anticipated to be likely)  - if develops abdominal pain or signs of systemic illness and not safe for surgery, consider placement of percutaneous cholecystostomy drain for gallbladder decompression, though patient likely to attempt self-removal of drain  - currently no indication for emergent surgery or cholecystostomy drain  - medical management of comorbidities per primary team  - GI consultation appreciated  - DVT prophylaxis  I have personally reviewed the patient's chart, evaluated/examined the patient, proposed the recommended management, and discussed these recommendations with the patient's son to his expressed satisfaction as well as with patient's RN and ED physician.  Thank you for the opportunity to participate in this patient's care.  -- Marilynne Drivers Rosana Hoes, MD, [place]: [facility] - Partnering for exceptional care. Office: [phone]    SURGICAL CONSULTATION NOTE  (initial) - cpt: 97673  HISTORY OF PRESENT ILLNESS (HPI):  84 y.o. female presented to [facility] ED on 10/01/17 for evaluation of chest pain and SOB. Patient's son helped provide the history given the patient's dementia history. He reported that she had 2 days of nausea and emesis sometime last week, but this resolved after being seen in the emergency room. Since that time, her appetite has never fully returned, and yesterday she complained of central chest pain that her son describes lasted for about 2-3 hours before resolving. She has similar episodes in the past and occur intermittently. Her pain did not appear to be associated with food. She did not complain of abdominal pain, though her son identifies her "chest pain" by pointing to his epigastrium. Her son thinks that she was told she had gallstones about 1 year ago. She resides at home and is described by her son as fairly independent despite chronic dementia. Her son takes her to eat, but states she is otherwise independent.   Surgery is consulted by emergency physician Dr. Eula Listen, MD in this context for evaluation and management of cholelithiasis and elevated LFTs and T bili.   PAST MEDICAL HISTORY (PMH):  Past Medical History:  Diagnosis Date  . Asthma   . Cancer ([place])    breast  . Dementia   . Hearing loss   . Macular degeneration      PAST SURGICAL HISTORY ([place]):  Past Surgical History:  Procedure Laterality Date  . MASTECTOMY       MEDICATIONS:  Prior to Admission medications   Not on File     ALLERGIES:  Allergies  Allergen Reactions  . Penicillin G     Has patient had a PCN reaction causing immediate rash, facial/tongue/throat swelling, SOB or lightheadedness with hypotension: no  Has patient had a PCN reaction causing severe rash involving mucus membranes or  skin necrosis: no Has patient had a PCN reaction that required hospitalization no Has patient had a PCN reaction occurring within the last 10  years: no If all of the above answers are "NO", then may proceed with Cephalosporin use.      SOCIAL HISTORY:  Social History   Socioeconomic History  . Marital status: Widowed    Spouse name: Not on file  . Number of children: Not on file  . Years of education: Not on file  . Highest education level: Not on file  Occupational History  . Not on file  Social Needs  . Financial resource strain: Not on file  . Food insecurity:    Worry: Not on file    Inability: Not on file  . Transportation needs:    Medical: Not on file    Non-medical: Not on file  Tobacco Use  . Smoking status: Never Smoker  . Smokeless tobacco: Never Used  Substance and Sexual Activity  . Alcohol use: No  . Drug use: Not on file  . Sexual activity: Not on file  Lifestyle  . Physical activity:    Days per week: Not on file    Minutes per session: Not on file  . Stress: Not on file  Relationships  . Social connections:    Talks on phone: Not on file    Gets together: Not on file    Attends religious service: Not on file    Active member of club or organization: Not on file    Attends meetings of clubs or organizations: Not on file    Relationship status: Not on file  . Intimate partner violence:    Fear of current or ex partner: Not on file    Emotionally abused: Not on file    Physically abused: Not on file    Forced sexual activity: Not on file  Other Topics Concern  . Not on file  Social History Narrative   Lives at home by herself, family checks on her.   Does not use any walking assisted devices.    The patient currently resides (home / rehab facility / nursing home): Home, family provides assistance  The patient normally is (ambulatory / bedbound): Ambulatory   FAMILY HISTORY:  Family History  Problem Relation Age of Onset  . Heart attack Mother   . Heart attack Father      REVIEW OF SYSTEMS:  (Son helps obtain ROS) Constitutional: denies weight loss, fever, chills, or sweats   Eyes: +Blind, history of eye injury  ENT: denies sore throat + hearing problems  Respiratory: denies shortness of breath, wheezing  Cardiovascular: denies chest pain, palpitations  Gastrointestinal: denies abdominal pain, N/V, or diarrhea/and bowel function as per HPI Genitourinary: denies burning with urination or urinary frequency Musculoskeletal: denies any other joint pains or cramps  Skin: denies any other rashes or skin discolorations  Neurological: denies any other headache, dizziness, weakness  Psychiatric: denies any other depression, anxiety   All other review of systems were negative   VITAL SIGNS:  Temp:  [97.7 F (36.5 C)-98.8 F (37.1 C)] 98.8 F (37.1 C) (09/24 0850) Pulse Rate:  [41-109] 86 (09/24 0850) Resp:  [16-27] 16 (09/24 0603) BP: (115-133)/(53-76) 133/53 (09/24 0850) SpO2:  [80 %-98 %] 95 % (09/24 0850) Weight:  [58.9 kg] 58.9 kg (09/24 0115)     Height: 4\' 11"  (149.9 cm) Weight: 58.9 kg BMI (Calculated): 26.22   INTAKE/OUTPUT:  This shift: Total I/O In: -  Out:  100 [Urine:100]  Last 2 shifts: @IOLAST2SHIFTS @   PHYSICAL EXAM:  Constitutional:  -- Normal body habitus  -- Awake, alert, but not oriented consistent with dementia history, no apparent distress Eyes:  -- Pupils equally round and reactive to light  -- No scleral icterus, B/L no occular discharge Ear, nose, throat: -- Neck is FROM WNL Pulmonary:  -- No wheezes or rhales -- Equal breath sounds bilaterally -- Breathing non-labored at rest Cardiovascular:  -- S1, S2 present  -- No pericardial rubs  Gastrointestinal:  -- Abdomen soft, nontender, non-distended, no guarding or rebound tenderness -- Negative Murphys Musculoskeletal and Integumentary:  -- Wounds or skin discoloration: None appreciated -- Extremities: B/L UE and LE FROM, hands and feet warm, no edema  Neurologic:  -- Motor function: Intact and symmetric -- Sensation: Intact and symmetric Psychiatric:  -- Mood and affect  WNL    Labs:  CBC Latest Ref Rng & Units 10/02/2017 10/01/2017 11/26/2016  WBC 3.6 - 11.0 K/uL 14.7(H) 10.6 10.7  Hemoglobin 12.0 - 16.0 g/dL 11.7(L) 12.1 11.7(L)  Hematocrit 35.0 - 47.0 % 33.9(L) 35.2 33.9(L)  Platelets 150 - 440 K/uL 252 269 223   CMP Latest Ref Rng & Units 10/02/2017 10/01/2017 11/26/2016  Glucose 70 - 99 mg/dL 112(H) 154(H) 123(H)  BUN 8 - 23 mg/dL 20 23 24(H)  Creatinine 0.44 - 1.00 mg/dL 1.02(H) 0.94 0.86  Sodium 135 - 145 mmol/L 143 141 136  Potassium 3.5 - 5.1 mmol/L 3.8 3.9 4.3  Chloride 98 - 111 mmol/L 112(H) 110 106  CO2 22 - 32 mmol/L 24 23 22   Calcium 8.9 - 10.3 mg/dL 8.1(L) 8.7(L) 8.5(L)  Total Protein 6.5 - 8.1 g/dL 5.8(L) 7.1 5.9(L)  Total Bilirubin 0.3 - 1.2 mg/dL 2.5(H) 2.5(H) 0.9  Alkaline Phos 38 - 126 U/L 372(H) 372(H) 124  AST 15 - 41 U/L 251(H) 231(H) 41  ALT 0 - 44 U/L 171(H) 149(H) 103(H)    Imaging studies:  US Abdomen RUQ on 09/23  IMPRESSION: Moderate cholelithiasis with sludge and moderate gallbladder wall thickening. Upper normal diameter of the common bile duct measuring 9 mm. Findings may be seen with acute cholecystitis.  Mild increased echogenicity of the portal triads over the left lobe of the liver which can be seen with hepatitis.  MRCP on 10/02/17  IMPRESSION: 1. Cholelithiasis. Marked diffuse gallbladder wall thickening. Small amount of pericholecystic fluid. Acute cholecystitis cannot be excluded. 2. No significant intrahepatic biliary ductal dilatation. Chronic dilatation of the common bile duct (8 mm diameter), stable since 2012 CT abdomen study. No choledocholithiasis. Moderate periampullary duodenal diverticulum. 3. Small hiatal hernia. 4. Marked colonic diverticulosis. 5.  Aortic Atherosclerosis (ICD10-I70.0).   Assessment/Plan: (ICD-10's: K29.86) 84 y.o. female with cholelithiasis and elevated LFTs and T Bili, complicated by pertinent comorbidities including macular degeneration, COPD, chest pain,  dementia, and advanced age.   - At this time, she is not complaining of pain, her abdominal exam is benign, and her VSS without evidence of systemic infection. MRCP this morning again showed cholelithiasis but was without evidence of CBD stone. Her LFTs and T bili remain unchanged and elevated.   - Will continue to trend LFTs and monitor leukocytosis. No indication for ERCP based on MRCP findings. GI is following appreciate their input  -  At this time, she does not appear to have any indication for emergent surgical intervention. If she were to develop pain, signs of systemic illness, or worsening of her LFTs, she would require risk stratification prior to surgery  vs percutaneous cholecystostomy tube  - NPO for now, IVF, IV ABx  - Mobilize if tolerates  - DVT prophylaxis  All of the above findings and recommendations were discussed with the patient and her family, and all of patient's and her family's questions were answered to their expressed satisfaction.  Thank you for the opportunity to participate in this patient's care.   -- Edison Simon, PA-C [facility] 10/02/2017, 10:39 AM [phone] M-F: 7am - 4pm

## 2017-10-02 NOTE — Progress Notes (Signed)
[place] [facility] [place] NAME: Brandi Nelson    MR#:  [id number]  DATE OF BIRTH:  Jun 08, 1907  SUBJECTIVE:  CHIEF COMPLAINT:   Chief Complaint  Patient presents with  . Chest Pain  . Shortness of Breath   Came with pain and nausea for last few days to emergency room multiple times and noted to have elevated LFTs and acute cholecystitis yesterday. Started on IV antibiotics.  Feels better today. Sitting in the chair.  Patient is having visual problems and cannot see at her baseline.  Also at her baseline she have severe dementia. REVIEW OF SYSTEMS:   Patient have dementia and she could not answer the questions reliably.  She is denying any complaints currently.  ROS  DRUG ALLERGIES:   Allergies  Allergen Reactions  . Penicillin G     Has patient had a PCN reaction causing immediate rash, facial/tongue/throat swelling, SOB or lightheadedness with hypotension: no  Has patient had a PCN reaction causing severe rash involving mucus membranes or skin necrosis: no Has patient had a PCN reaction that required hospitalization no Has patient had a PCN reaction occurring within the last 10 years: no If all of the above answers are "NO", then may proceed with Cephalosporin use.     VITALS:  Blood pressure 127/62, pulse 84, temperature 98.6 F (37 C), temperature source Axillary, resp. rate 16, height 4\' 11"  (1.499 m), weight 58.9 kg, SpO2 95 %.  PHYSICAL EXAMINATION:  GENERAL:  84 y.o.-year-old patient sitting in chair with no acute distress.  EYES: Pupils equal, round, reactive to light . No scleral icterus. Extraocular muscles intact. Pt is not able to see at her baseline. HEENT: Head atraumatic, normocephalic. Oropharynx and nasopharynx clear.  NECK:  Supple, no jugular venous distention. No thyroid enlargement, no tenderness.  LUNGS: Normal breath sounds bilaterally, no wheezing, rales,rhonchi or crepitation. No use of accessory muscles of  respiration.  CARDIOVASCULAR: S1, S2 normal. systolic murmurs, .  ABDOMEN: Soft, nontender, nondistended. Bowel sounds present. No organomegaly or mass.  EXTREMITIES: No pedal edema, cyanosis, or clubbing.  NEUROLOGIC: Cranial nerves II through XII are intact. Muscle strength 3-4/5 in all extremities. Sensation intact. Gait not checked.  PSYCHIATRIC: The patient is alert and oriented x 1.   SKIN: No obvious rash, lesion, or ulcer.   Physical Exam LABORATORY PANEL:   CBC Recent Labs  Lab 10/02/17 0354  WBC 14.7*  HGB 11.7*  HCT 33.9*  PLT 252   ------------------------------------------------------------------------------------------------------------------  Chemistries  Recent Labs  Lab 10/02/17 0354  NA 143  K 3.8  CL 112*  CO2 24  GLUCOSE 112*  BUN 20  CREATININE 1.02*  CALCIUM 8.1*  AST 251*  ALT 171*  ALKPHOS 372*  BILITOT 2.5*   ------------------------------------------------------------------------------------------------------------------  Cardiac Enzymes Recent Labs  Lab 10/01/17 1334 10/01/17 1710  TROPONINI <0.03 <0.03   ------------------------------------------------------------------------------------------------------------------  RADIOLOGY:  Dg Chest 2 View  Result Date: 10/01/2017 CLINICAL DATA:  Chest pain, shortness of breath EXAM: CHEST - 2 VIEW COMPARISON:  11/25/2016 FINDINGS: There is bilateral chronic interstitial thickening. There is no focal parenchymal opacity. There is no pleural effusion or pneumothorax. There is stable cardiomegaly. There is no acute osseous abnormality. There is an old healed left proximal humeral fracture. There is degenerative disc disease of the thoracic spine. IMPRESSION: No active cardiopulmonary disease. Electronically Signed   By: Kathreen Devoid   On: 10/01/2017 13:53   Mr 3d Recon At Scanner  Result Date: 10/02/2017  CLINICAL DATA:  Inpatient. Epigastric abdominal pain, nausea and vomiting. Cholelithiasis  with dilated CBD on ultrasound. Abnormal liver function tests. EXAM: MRI ABDOMEN WITHOUT AND WITH CONTRAST (INCLUDING MRCP) TECHNIQUE: Multiplanar multisequence MR imaging of the abdomen was performed both before and after the administration of intravenous contrast. Heavily T2-weighted images of the biliary and pancreatic ducts were obtained, and three-dimensional MRCP images were rendered by post processing. CONTRAST:  31mL MULTIHANCE GADOBENATE DIMEGLUMINE 529 MG/ML IV SOLN COMPARISON:  10/01/2017 abdominal sonogram. FINDINGS: Lower chest: Medial left lower lobe 8 mm pulmonary nodule (series 17/image 11), stable since 2012 CT abdomen study, considered benign. Hepatobiliary: Normal liver size and configuration. No hepatic steatosis. No liver mass. Layering sub 5 mm gallstones in the gallbladder. No significant gallbladder distention. Marked diffuse gallbladder wall thickening with a small amount of pericholecystic fluid. No significant intrahepatic biliary ductal dilatation. Mildly dilated common bile duct, unchanged since 12/14/2010 CT abdomen study. Common bile duct diameter 8 mm. No evidence of choledocholithiasis. Moderate periampullary duodenal diverticulum. Pancreas: No pancreatic mass or significant duct dilation. No pancreas divisum. Spleen: Normal size. No mass. Adrenals/Urinary Tract: Normal adrenals. No hydronephrosis. Small simple parapelvic renal cysts in both kidneys. No suspicious renal masses. Stomach/Bowel: Small hiatal hernia. Otherwise normal nondistended stomach. Visualized small and large bowel is normal caliber, with no bowel wall thickening. Marked colonic diverticulosis. Vascular/Lymphatic: Atherosclerotic nonaneurysmal abdominal aorta. Patent portal, splenic, hepatic and renal veins. No pathologically enlarged lymph nodes in the abdomen. Other: Trace perihepatic ascites anteriorly. No focal fluid collection. Musculoskeletal: No aggressive appearing focal osseous lesions. IMPRESSION: 1.  Cholelithiasis. Marked diffuse gallbladder wall thickening. Small amount of pericholecystic fluid. Acute cholecystitis cannot be excluded. 2. No significant intrahepatic biliary ductal dilatation. Chronic dilatation of the common bile duct (8 mm diameter), stable since 2012 CT abdomen study. No choledocholithiasis. Moderate periampullary duodenal diverticulum. 3. Small hiatal hernia. 4. Marked colonic diverticulosis. 5.  Aortic Atherosclerosis (ICD10-I70.0). Electronically Signed   By: Ilona Sorrel M.D.   On: 10/02/2017 01:58   Mr Abdomen Mrcp Moise Boring Contast  Result Date: 10/02/2017 CLINICAL DATA:  Inpatient. Epigastric abdominal pain, nausea and vomiting. Cholelithiasis with dilated CBD on ultrasound. Abnormal liver function tests. EXAM: MRI ABDOMEN WITHOUT AND WITH CONTRAST (INCLUDING MRCP) TECHNIQUE: Multiplanar multisequence MR imaging of the abdomen was performed both before and after the administration of intravenous contrast. Heavily T2-weighted images of the biliary and pancreatic ducts were obtained, and three-dimensional MRCP images were rendered by post processing. CONTRAST:  78mL MULTIHANCE GADOBENATE DIMEGLUMINE 529 MG/ML IV SOLN COMPARISON:  10/01/2017 abdominal sonogram. FINDINGS: Lower chest: Medial left lower lobe 8 mm pulmonary nodule (series 17/image 11), stable since 2012 CT abdomen study, considered benign. Hepatobiliary: Normal liver size and configuration. No hepatic steatosis. No liver mass. Layering sub 5 mm gallstones in the gallbladder. No significant gallbladder distention. Marked diffuse gallbladder wall thickening with a small amount of pericholecystic fluid. No significant intrahepatic biliary ductal dilatation. Mildly dilated common bile duct, unchanged since 12/14/2010 CT abdomen study. Common bile duct diameter 8 mm. No evidence of choledocholithiasis. Moderate periampullary duodenal diverticulum. Pancreas: No pancreatic mass or significant duct dilation. No pancreas divisum.  Spleen: Normal size. No mass. Adrenals/Urinary Tract: Normal adrenals. No hydronephrosis. Small simple parapelvic renal cysts in both kidneys. No suspicious renal masses. Stomach/Bowel: Small hiatal hernia. Otherwise normal nondistended stomach. Visualized small and large bowel is normal caliber, with no bowel wall thickening. Marked colonic diverticulosis. Vascular/Lymphatic: Atherosclerotic nonaneurysmal abdominal aorta. Patent portal, splenic, hepatic and renal veins. No pathologically enlarged  lymph nodes in the abdomen. Other: Trace perihepatic ascites anteriorly. No focal fluid collection. Musculoskeletal: No aggressive appearing focal osseous lesions. IMPRESSION: 1. Cholelithiasis. Marked diffuse gallbladder wall thickening. Small amount of pericholecystic fluid. Acute cholecystitis cannot be excluded. 2. No significant intrahepatic biliary ductal dilatation. Chronic dilatation of the common bile duct (8 mm diameter), stable since 2012 CT abdomen study. No choledocholithiasis. Moderate periampullary duodenal diverticulum. 3. Small hiatal hernia. 4. Marked colonic diverticulosis. 5.  Aortic Atherosclerosis (ICD10-I70.0). Electronically Signed   By: Ilona Sorrel M.D.   On: 10/02/2017 01:58   US Abdomen Limited Ruq  Result Date: 10/01/2017 CLINICAL DATA:  Abnormal LFTs. EXAM: ULTRASOUND ABDOMEN LIMITED RIGHT UPPER QUADRANT COMPARISON:  None. FINDINGS: Gallbladder: Moderate cholelithiasis with sludge and moderate gallbladder wall thickening measuring up to 1.2 cm. Negative sonographic Murphy sign. Largest stone measures 7 mm. No adjacent free fluid. Common bile duct: Diameter: 9 mm. Liver: Suggestion of echogenic portal triads over the left lobe without focal mass. Portal vein is patent on color Doppler imaging with normal direction of blood flow towards the liver. IMPRESSION: Moderate cholelithiasis with sludge and moderate gallbladder wall thickening. Upper normal diameter of the common bile duct measuring  9 mm. Findings may be seen with acute cholecystitis. Mild increased echogenicity of the portal triads over the left lobe of the liver which can be seen with hepatitis. Electronically Signed   By: Marin Olp M.D.   On: 10/01/2017 19:39    ASSESSMENT AND PLAN:   Principal Problem:   Cholecystitis with cholelithiasis Active Problems:   Dehydration   Dementia    Cholecystitis with cholelithiasis -IV antibiotics, GI consult, MRCP done, no need for ERCP. Appreciated surgical consult, no need for surgry now as pt have no pain, cont Abx and Fluids.    Dehydration -IV fluids for hydration   Pt is pulling out IV line.    Dementia -patient seems to be on donepezil at home per chart review, we will order this when she is taking p.o.    Elevated LFT   This is due to Cholecystitis, Monitor.   All the records are reviewed and case discussed with Care Management/Social Workerr. Management plans discussed with the patient, family and they are in agreement.  CODE STATUS: DNR  TOTAL TIME TAKING CARE OF THIS PATIENT: 35 minutes.    POSSIBLE D/C IN 1-2 DAYS, DEPENDING ON CLINICAL CONDITION.   Vaughan Basta M.D on 10/02/2017   Between 7am to 6pm - Pager - [phone]  After 6pm go to [email or web address] - password [email or web address] [email or web address]  Office  [phone]  CC: Primary care physician; Baxter Hire, MD  Note: This dictation was prepared with Dragon dictation along with smaller phrase technology. Any transcriptional errors that result from this process are unintentional.

## 2017-10-02 NOTE — ED Notes (Signed)
Pt is back from MRI.

## 2017-10-02 NOTE — Progress Notes (Signed)
Family Meeting Note  Advance Directive:yes  Today a meeting took place with the son.  Patient is unable to participate due KD:PTELMR capacity dementia   The following clinical team members were present during this meeting:MD  The following were discussed:Patient's diagnosis: acute cholecystitis, Dementia, Patient's progosis: Unable to determine and Goals for treatment: DNR  Additional follow-up to be provided: Surgery, GI  Time spent during discussion:20 minutes  Vaughan Basta, MD

## 2017-10-02 NOTE — ED Notes (Signed)
Pt is still in MRI.

## 2017-10-02 NOTE — Progress Notes (Signed)
Spoke with Son [name] about bringing in healthcare power of attorney paperwork.

## 2017-10-02 NOTE — ED Notes (Signed)
BP was attempted, pt was still moving her arm and it would take.

## 2017-10-02 NOTE — Consult Note (Signed)
Jonathon Bellows , MD [address] [place], [place], [place], [address] [address], [place], [place], [place], [address] Phone: [phone]  Fax: [phone]  Consultation  Referring Provider:    Dr Marthann Schiller  Primary Care Physician:  Baxter Hire, MD Primary Gastroenterologist:  None          Reason for Consultation:     Abnormal LFT's   Date of Admission:  10/01/2017 Date of Consultation:  10/02/2017         HPI:   Brandi Nelson is a 84 y.o. female presented to the hospital on 10/01/2017 with abdominal pain.  In the emergency room an ultrasound of the liver demonstrated moderate cholelithiasis with sludge and moderate gallbladder wall thickening measuring up to 1.2 cm.  No adjacent free fluid.  The common bile duct was borderline dilated at 9 mm.  Sequently had an MRCP which showed cholelithiasis marked diffuse gallbladder wall thickening and small amount of pericholecystic fluid.  No significant intrahepatic biliary ductal dilation and she is known to have chronic dilation of the CBD since 2012 per CT scan.  There is no choledocholithiasis.  Moderate periampullary duodenal diverticulum.  He had elevated transaminases with AST of 231 ALT of 149 alkaline phosphatase of 372 and a total bilirubin of 2.5 with the predominant direct component at 1.7.  This morning it is pretty much the same not much significant change.  In her room was her son.  The patient cannot give any history as she has severe dementia.  He states that she presented to the hospital with severe epigastric pain.  There is a second episode that has occurred.  A similar episode occurred a few weeks back and she was told she had a viral illness and discharged home.  Presently she says she is not has no abdominal pain.  At baseline she has a very good appetite and eats a Mallie Mussel and has an ice cream at [facility] every single day which she has been doing so for the past 7 years.  She consumes a lot of bacon which she enjoys at  home in the morning.  Past Medical History:  Diagnosis Date  . Asthma   . Cancer ([place])    breast  . Dementia   . Hearing loss   . Macular degeneration     Past Surgical History:  Procedure Laterality Date  . MASTECTOMY      Prior to Admission medications   Not on File    Family History  Problem Relation Age of Onset  . Heart attack Mother   . Heart attack Father      Social History   Tobacco Use  . Smoking status: Never Smoker  . Smokeless tobacco: Never Used  Substance Use Topics  . Alcohol use: No  . Drug use: Not on file    Allergies as of 10/01/2017 - Review Complete 10/01/2017  Allergen Reaction Noted  . Penicillin g      Review of Systems:    All systems reviewed and negative except where noted in HPI.   Physical Exam:  Vital signs in last 24 hours: Temp:  [97.7 F (36.5 C)-98.8 F (37.1 C)] 98.8 F (37.1 C) (09/24 0850) Pulse Rate:  [41-109] 86 (09/24 0850) Resp:  [16-27] 16 (09/24 0603) BP: (115-133)/(53-76) 133/53 (09/24 0850) SpO2:  [80 %-98 %] 95 % (09/24 0850) Weight:  [58.9 kg] 58.9 kg (09/24 0115)   General: Appears comfortable not in any pain or distress. Head:  Normocephalic and atraumatic. Eyes:   No icterus.   Conjunctiva pink. PERRLA. Ears:  Normal auditory acuity. Neck:  Supple; no masses or thyroidomegaly Lungs: Respirations even and unlabored. Lungs clear to auscultation bilaterally.   No wheezes, crackles, or rhonchi.  Heart:  Regular rate and rhythm;  Without murmur, clicks, rubs or gallops Abdomen:  Soft, nondistended, nontender. Normal bowel sounds. No appreciable masses or hepatomegaly.  No rebound or guarding.  Neurologic:  Alert and oriented x 0, moving all 4 limbs.   Skin:  Intact without significant lesions or rashes. Cervical Nodes:  No significant cervical adenopathy. Psych:  Alert   LAB RESULTS: Recent Labs    10/01/17 1334 10/02/17 0354  WBC 10.6 14.7*  HGB 12.1 11.7*  HCT 35.2 33.9*  PLT 269 252    BMET Recent Labs    10/01/17 1334 10/02/17 0354  NA 141 143  K 3.9 3.8  CL 110 112*  CO2 23 24  GLUCOSE 154* 112*  BUN 23 20  CREATININE 0.94 1.02*  CALCIUM 8.7* 8.1*   LFT Recent Labs    10/01/17 1710 10/02/17 0354  PROT 7.1 5.8*  ALBUMIN 4.0 2.9*  AST 231* 251*  ALT 149* 171*  ALKPHOS 372* 372*  BILITOT 2.5* 2.5*  BILIDIR 1.7*  --   IBILI 0.8  --    PT/INR No results for input(s): LABPROT, INR in the last 72 hours.  STUDIES: Dg Chest 2 View  Result Date: 10/01/2017 CLINICAL DATA:  Chest pain, shortness of breath EXAM: CHEST - 2 VIEW COMPARISON:  11/25/2016 FINDINGS: There is bilateral chronic interstitial thickening. There is no focal parenchymal opacity. There is no pleural effusion or pneumothorax. There is stable cardiomegaly. There is no acute osseous abnormality. There is an old healed left proximal humeral fracture. There is degenerative disc disease of the thoracic spine. IMPRESSION: No active cardiopulmonary disease. Electronically Signed   By: Kathreen Devoid   On: 10/01/2017 13:53   Mr 3d Recon At Scanner  Result Date: 10/02/2017 CLINICAL DATA:  Inpatient. Epigastric abdominal pain, nausea and vomiting. Cholelithiasis with dilated CBD on ultrasound. Abnormal liver function tests. EXAM: MRI ABDOMEN WITHOUT AND WITH CONTRAST (INCLUDING MRCP) TECHNIQUE: Multiplanar multisequence MR imaging of the abdomen was performed both before and after the administration of intravenous contrast. Heavily T2-weighted images of the biliary and pancreatic ducts were obtained, and three-dimensional MRCP images were rendered by post processing. CONTRAST:  87mL MULTIHANCE GADOBENATE DIMEGLUMINE 529 MG/ML IV SOLN COMPARISON:  10/01/2017 abdominal sonogram. FINDINGS: Lower chest: Medial left lower lobe 8 mm pulmonary nodule (series 17/image 11), stable since 2012 CT abdomen study, considered benign. Hepatobiliary: Normal liver size and configuration. No hepatic steatosis. No liver mass.  Layering sub 5 mm gallstones in the gallbladder. No significant gallbladder distention. Marked diffuse gallbladder wall thickening with a small amount of pericholecystic fluid. No significant intrahepatic biliary ductal dilatation. Mildly dilated common bile duct, unchanged since 12/14/2010 CT abdomen study. Common bile duct diameter 8 mm. No evidence of choledocholithiasis. Moderate periampullary duodenal diverticulum. Pancreas: No pancreatic mass or significant duct dilation. No pancreas divisum. Spleen: Normal size. No mass. Adrenals/Urinary Tract: Normal adrenals. No hydronephrosis. Small simple parapelvic renal cysts in both kidneys. No suspicious renal masses. Stomach/Bowel: Small hiatal hernia. Otherwise normal nondistended stomach. Visualized small and large bowel is normal caliber, with no bowel wall thickening. Marked colonic diverticulosis. Vascular/Lymphatic: Atherosclerotic nonaneurysmal abdominal aorta. Patent portal, splenic, hepatic and renal veins. No pathologically enlarged lymph nodes in the abdomen. Other: Trace perihepatic ascites  anteriorly. No focal fluid collection. Musculoskeletal: No aggressive appearing focal osseous lesions. IMPRESSION: 1. Cholelithiasis. Marked diffuse gallbladder wall thickening. Small amount of pericholecystic fluid. Acute cholecystitis cannot be excluded. 2. No significant intrahepatic biliary ductal dilatation. Chronic dilatation of the common bile duct (8 mm diameter), stable since 2012 CT abdomen study. No choledocholithiasis. Moderate periampullary duodenal diverticulum. 3. Small hiatal hernia. 4. Marked colonic diverticulosis. 5.  Aortic Atherosclerosis (ICD10-I70.0). Electronically Signed   By: Ilona Sorrel M.D.   On: 10/02/2017 01:58   Mr Abdomen Mrcp Moise Boring Contast  Result Date: 10/02/2017 CLINICAL DATA:  Inpatient. Epigastric abdominal pain, nausea and vomiting. Cholelithiasis with dilated CBD on ultrasound. Abnormal liver function tests. EXAM: MRI ABDOMEN  WITHOUT AND WITH CONTRAST (INCLUDING MRCP) TECHNIQUE: Multiplanar multisequence MR imaging of the abdomen was performed both before and after the administration of intravenous contrast. Heavily T2-weighted images of the biliary and pancreatic ducts were obtained, and three-dimensional MRCP images were rendered by post processing. CONTRAST:  64mL MULTIHANCE GADOBENATE DIMEGLUMINE 529 MG/ML IV SOLN COMPARISON:  10/01/2017 abdominal sonogram. FINDINGS: Lower chest: Medial left lower lobe 8 mm pulmonary nodule (series 17/image 11), stable since 2012 CT abdomen study, considered benign. Hepatobiliary: Normal liver size and configuration. No hepatic steatosis. No liver mass. Layering sub 5 mm gallstones in the gallbladder. No significant gallbladder distention. Marked diffuse gallbladder wall thickening with a small amount of pericholecystic fluid. No significant intrahepatic biliary ductal dilatation. Mildly dilated common bile duct, unchanged since 12/14/2010 CT abdomen study. Common bile duct diameter 8 mm. No evidence of choledocholithiasis. Moderate periampullary duodenal diverticulum. Pancreas: No pancreatic mass or significant duct dilation. No pancreas divisum. Spleen: Normal size. No mass. Adrenals/Urinary Tract: Normal adrenals. No hydronephrosis. Small simple parapelvic renal cysts in both kidneys. No suspicious renal masses. Stomach/Bowel: Small hiatal hernia. Otherwise normal nondistended stomach. Visualized small and large bowel is normal caliber, with no bowel wall thickening. Marked colonic diverticulosis. Vascular/Lymphatic: Atherosclerotic nonaneurysmal abdominal aorta. Patent portal, splenic, hepatic and renal veins. No pathologically enlarged lymph nodes in the abdomen. Other: Trace perihepatic ascites anteriorly. No focal fluid collection. Musculoskeletal: No aggressive appearing focal osseous lesions. IMPRESSION: 1. Cholelithiasis. Marked diffuse gallbladder wall thickening. Small amount of  pericholecystic fluid. Acute cholecystitis cannot be excluded. 2. No significant intrahepatic biliary ductal dilatation. Chronic dilatation of the common bile duct (8 mm diameter), stable since 2012 CT abdomen study. No choledocholithiasis. Moderate periampullary duodenal diverticulum. 3. Small hiatal hernia. 4. Marked colonic diverticulosis. 5.  Aortic Atherosclerosis (ICD10-I70.0). Electronically Signed   By: Ilona Sorrel M.D.   On: 10/02/2017 01:58   US Abdomen Limited Ruq  Result Date: 10/01/2017 CLINICAL DATA:  Abnormal LFTs. EXAM: ULTRASOUND ABDOMEN LIMITED RIGHT UPPER QUADRANT COMPARISON:  None. FINDINGS: Gallbladder: Moderate cholelithiasis with sludge and moderate gallbladder wall thickening measuring up to 1.2 cm. Negative sonographic Murphy sign. Largest stone measures 7 mm. No adjacent free fluid. Common bile duct: Diameter: 9 mm. Liver: Suggestion of echogenic portal triads over the left lobe without focal mass. Portal vein is patent on color Doppler imaging with normal direction of blood flow towards the liver. IMPRESSION: Moderate cholelithiasis with sludge and moderate gallbladder wall thickening. Upper normal diameter of the common bile duct measuring 9 mm. Findings may be seen with acute cholecystitis. Mild increased echogenicity of the portal triads over the left lobe of the liver which can be seen with hepatitis. Electronically Signed   By: Marin Olp M.D.   On: 10/01/2017 19:39      Impression / Plan:  Brandi MIRELES is a 84 y.o. y/o female admitted with acute onset abdominal pain abnormal liver function tests.  She does have gallstones on imaging.  The pattern of her liver function tests suggest very likely she passed a gallstone.  Plan 1 . Check lipase. 2.  No role for ERCP as MRCP shows no ductal obstruction.  Very likely she probably passed a gallstone.  I see that surgery is following and decision about any cholecystectomy would be determined by them.  Thank you for  involving me in the care of this patient.      LOS: 1 day   Jonathon Bellows, MD  10/02/2017, 10:59 AM

## 2017-10-03 DIAGNOSIS — R531 Weakness: Secondary | ICD-10-CM

## 2017-10-03 DIAGNOSIS — F0390 Unspecified dementia without behavioral disturbance: Secondary | ICD-10-CM

## 2017-10-03 DIAGNOSIS — K851 Biliary acute pancreatitis without necrosis or infection: Secondary | ICD-10-CM

## 2017-10-03 DIAGNOSIS — Z515 Encounter for palliative care: Secondary | ICD-10-CM

## 2017-10-03 DIAGNOSIS — Z66 Do not resuscitate: Secondary | ICD-10-CM

## 2017-10-03 LAB — LIPASE, BLOOD: LIPASE: 174 U/L — AB (ref 11–51)

## 2017-10-03 LAB — CBC
HCT: 32.8 % — ABNORMAL LOW (ref 35.0–47.0)
Hemoglobin: 11.7 g/dL — ABNORMAL LOW (ref 12.0–16.0)
MCH: 31.7 pg (ref 26.0–34.0)
MCHC: 35.8 g/dL (ref 32.0–36.0)
MCV: 88.6 fL (ref 80.0–100.0)
PLATELETS: 255 10*3/uL (ref 150–440)
RBC: 3.71 MIL/uL — ABNORMAL LOW (ref 3.80–5.20)
RDW: 15.4 % — AB (ref 11.5–14.5)
WBC: 8.9 10*3/uL (ref 3.6–11.0)

## 2017-10-03 LAB — COMPREHENSIVE METABOLIC PANEL
ALT: 117 U/L — ABNORMAL HIGH (ref 0–44)
AST: 96 U/L — AB (ref 15–41)
Albumin: 3.2 g/dL — ABNORMAL LOW (ref 3.5–5.0)
Alkaline Phosphatase: 329 U/L — ABNORMAL HIGH (ref 38–126)
Anion gap: 6 (ref 5–15)
BUN: 23 mg/dL (ref 8–23)
CHLORIDE: 113 mmol/L — AB (ref 98–111)
CO2: 25 mmol/L (ref 22–32)
Calcium: 8.6 mg/dL — ABNORMAL LOW (ref 8.9–10.3)
Creatinine, Ser: 0.96 mg/dL (ref 0.44–1.00)
GFR, EST AFRICAN AMERICAN: 52 mL/min — AB (ref >60)
GFR, EST NON AFRICAN AMERICAN: 45 mL/min — AB (ref >60)
Glucose, Bld: 79 mg/dL (ref 70–99)
POTASSIUM: 3.6 mmol/L (ref 3.5–5.1)
Sodium: 144 mmol/L (ref 135–145)
Total Bilirubin: 1.2 mg/dL (ref 0.3–1.2)
Total Protein: 5.9 g/dL — ABNORMAL LOW (ref 6.5–8.1)

## 2017-10-03 MED ORDER — TRAMADOL HCL 50 MG PO TABS: 50.0000 mg | ORAL_TABLET | Freq: Three times a day (TID) | ORAL | Status: DC | PRN

## 2017-10-03 MED ORDER — AMOXICILLIN-POT CLAVULANATE 400-57 MG/5ML PO SUSR
600.0000 mg | Freq: Two times a day (BID) | ORAL | Status: DC
  Administered 2017-10-03 – 2017-10-04 (×2): 600 mg via ORAL
  Filled 2017-10-03 (×5): qty 7.5

## 2017-10-03 NOTE — Progress Notes (Signed)
[place] at [place] NAME: Brandi Nelson    MR#:  [id number]  DATE OF BIRTH:  1907-12-31  SUBJECTIVE:  CHIEF COMPLAINT:   Chief Complaint  Patient presents with  . Chest Pain  . Shortness of Breath   Came with pain and nausea for last few days to emergency room multiple times and noted to have elevated LFTs and acute cholecystitis yesterday. Started on IV antibiotics.  Feels better today. Sitting in the chair.  Patient is having visual problems and cannot see at her baseline.  Also at her baseline she have severe dementia. Tolerated liquid diet, want to eat food.  REVIEW OF SYSTEMS:   Patient have dementia and she could not answer the questions reliably.  She is denying any complaints currently.  ROS  DRUG ALLERGIES:   Allergies  Allergen Reactions  . Penicillin G     Has patient had a PCN reaction causing immediate rash, facial/tongue/throat swelling, SOB or lightheadedness with hypotension: no  Has patient had a PCN reaction causing severe rash involving mucus membranes or skin necrosis: no Has patient had a PCN reaction that required hospitalization no Has patient had a PCN reaction occurring within the last 10 years: no If all of the above answers are "NO", then may proceed with Cephalosporin use.     VITALS:  Blood pressure (!) 166/85, pulse 85, temperature 98.7 F (37.1 C), temperature source Oral, resp. rate 18, height 4\' 11"  (1.499 m), weight 58.9 kg, SpO2 99 %.  PHYSICAL EXAMINATION:  GENERAL:  84 y.o.-year-old patient sitting in chair with no acute distress.  EYES: Pupils equal, round, reactive to light . No scleral icterus. Extraocular muscles intact. Pt is not able to see at her baseline. HEENT: Head atraumatic, normocephalic. Oropharynx and nasopharynx clear.  NECK:  Supple, no jugular venous distention. No thyroid enlargement, no tenderness.  LUNGS: Normal breath sounds bilaterally, no wheezing, rales,rhonchi or  crepitation. No use of accessory muscles of respiration.  CARDIOVASCULAR: S1, S2 normal. systolic murmurs, .  ABDOMEN: Soft, nontender, nondistended. Bowel sounds present. No organomegaly or mass.  EXTREMITIES: No pedal edema, cyanosis, or clubbing.  NEUROLOGIC: Cranial nerves II through XII are intact. Muscle strength 3-4/5 in all extremities. Sensation intact. Gait not checked.  PSYCHIATRIC: The patient is alert and oriented x 1.   SKIN: No obvious rash, lesion, or ulcer.   Physical Exam LABORATORY PANEL:   CBC Recent Labs  Lab 10/03/17 0505  WBC 8.9  HGB 11.7*  HCT 32.8*  PLT 255   ------------------------------------------------------------------------------------------------------------------  Chemistries  Recent Labs  Lab 10/03/17 0505  NA 144  K 3.6  CL 113*  CO2 25  GLUCOSE 79  BUN 23  CREATININE 0.96  CALCIUM 8.6*  AST 96*  ALT 117*  ALKPHOS 329*  BILITOT 1.2   ------------------------------------------------------------------------------------------------------------------  Cardiac Enzymes Recent Labs  Lab 10/01/17 1334 10/01/17 1710  TROPONINI <0.03 <0.03   ------------------------------------------------------------------------------------------------------------------  RADIOLOGY:  Mr 3d Recon At Scanner  Result Date: 10/02/2017 CLINICAL DATA:  Inpatient. Epigastric abdominal pain, nausea and vomiting. Cholelithiasis with dilated CBD on ultrasound. Abnormal liver function tests. EXAM: MRI ABDOMEN WITHOUT AND WITH CONTRAST (INCLUDING MRCP) TECHNIQUE: Multiplanar multisequence MR imaging of the abdomen was performed both before and after the administration of intravenous contrast. Heavily T2-weighted images of the biliary and pancreatic ducts were obtained, and three-dimensional MRCP images were rendered by post processing. CONTRAST:  78mL MULTIHANCE GADOBENATE DIMEGLUMINE 529 MG/ML IV SOLN COMPARISON:  10/01/2017 abdominal sonogram.  FINDINGS: Lower  chest: Medial left lower lobe 8 mm pulmonary nodule (series 17/image 11), stable since 2012 CT abdomen study, considered benign. Hepatobiliary: Normal liver size and configuration. No hepatic steatosis. No liver mass. Layering sub 5 mm gallstones in the gallbladder. No significant gallbladder distention. Marked diffuse gallbladder wall thickening with a small amount of pericholecystic fluid. No significant intrahepatic biliary ductal dilatation. Mildly dilated common bile duct, unchanged since 12/14/2010 CT abdomen study. Common bile duct diameter 8 mm. No evidence of choledocholithiasis. Moderate periampullary duodenal diverticulum. Pancreas: No pancreatic mass or significant duct dilation. No pancreas divisum. Spleen: Normal size. No mass. Adrenals/Urinary Tract: Normal adrenals. No hydronephrosis. Small simple parapelvic renal cysts in both kidneys. No suspicious renal masses. Stomach/Bowel: Small hiatal hernia. Otherwise normal nondistended stomach. Visualized small and large bowel is normal caliber, with no bowel wall thickening. Marked colonic diverticulosis. Vascular/Lymphatic: Atherosclerotic nonaneurysmal abdominal aorta. Patent portal, splenic, hepatic and renal veins. No pathologically enlarged lymph nodes in the abdomen. Other: Trace perihepatic ascites anteriorly. No focal fluid collection. Musculoskeletal: No aggressive appearing focal osseous lesions. IMPRESSION: 1. Cholelithiasis. Marked diffuse gallbladder wall thickening. Small amount of pericholecystic fluid. Acute cholecystitis cannot be excluded. 2. No significant intrahepatic biliary ductal dilatation. Chronic dilatation of the common bile duct (8 mm diameter), stable since 2012 CT abdomen study. No choledocholithiasis. Moderate periampullary duodenal diverticulum. 3. Small hiatal hernia. 4. Marked colonic diverticulosis. 5.  Aortic Atherosclerosis (ICD10-I70.0). Electronically Signed   By: Ilona Sorrel M.D.   On: 10/02/2017 01:58   Mr  Abdomen Mrcp Moise Boring Contast  Result Date: 10/02/2017 CLINICAL DATA:  Inpatient. Epigastric abdominal pain, nausea and vomiting. Cholelithiasis with dilated CBD on ultrasound. Abnormal liver function tests. EXAM: MRI ABDOMEN WITHOUT AND WITH CONTRAST (INCLUDING MRCP) TECHNIQUE: Multiplanar multisequence MR imaging of the abdomen was performed both before and after the administration of intravenous contrast. Heavily T2-weighted images of the biliary and pancreatic ducts were obtained, and three-dimensional MRCP images were rendered by post processing. CONTRAST:  70mL MULTIHANCE GADOBENATE DIMEGLUMINE 529 MG/ML IV SOLN COMPARISON:  10/01/2017 abdominal sonogram. FINDINGS: Lower chest: Medial left lower lobe 8 mm pulmonary nodule (series 17/image 11), stable since 2012 CT abdomen study, considered benign. Hepatobiliary: Normal liver size and configuration. No hepatic steatosis. No liver mass. Layering sub 5 mm gallstones in the gallbladder. No significant gallbladder distention. Marked diffuse gallbladder wall thickening with a small amount of pericholecystic fluid. No significant intrahepatic biliary ductal dilatation. Mildly dilated common bile duct, unchanged since 12/14/2010 CT abdomen study. Common bile duct diameter 8 mm. No evidence of choledocholithiasis. Moderate periampullary duodenal diverticulum. Pancreas: No pancreatic mass or significant duct dilation. No pancreas divisum. Spleen: Normal size. No mass. Adrenals/Urinary Tract: Normal adrenals. No hydronephrosis. Small simple parapelvic renal cysts in both kidneys. No suspicious renal masses. Stomach/Bowel: Small hiatal hernia. Otherwise normal nondistended stomach. Visualized small and large bowel is normal caliber, with no bowel wall thickening. Marked colonic diverticulosis. Vascular/Lymphatic: Atherosclerotic nonaneurysmal abdominal aorta. Patent portal, splenic, hepatic and renal veins. No pathologically enlarged lymph nodes in the abdomen. Other: Trace  perihepatic ascites anteriorly. No focal fluid collection. Musculoskeletal: No aggressive appearing focal osseous lesions. IMPRESSION: 1. Cholelithiasis. Marked diffuse gallbladder wall thickening. Small amount of pericholecystic fluid. Acute cholecystitis cannot be excluded. 2. No significant intrahepatic biliary ductal dilatation. Chronic dilatation of the common bile duct (8 mm diameter), stable since 2012 CT abdomen study. No choledocholithiasis. Moderate periampullary duodenal diverticulum. 3. Small hiatal hernia. 4. Marked colonic diverticulosis. 5.  Aortic Atherosclerosis (ICD10-I70.0). Electronically Signed  By: Ilona Sorrel M.D.   On: 10/02/2017 01:58    ASSESSMENT AND PLAN:   Principal Problem:   Cholecystitis with cholelithiasis Active Problems:   Dehydration   Dementia  *  Cholecystitis with cholelithiasis -IV antibiotics, GI consult, MRCP done, no need for ERCP. Appreciated surgical consult, no need for surgry now as pt have no pain, cont Abx and Fluids.  Pt pulled out IV line, will change to Oral augmentin.  Penicillin listed as allergy, but no reaction noted.  Will try oral and watch for any reactions/ rash.  * Acute pancreatitis   Resolving, no pain now.   Upgrade diet.  *  Dehydration -IV fluids for hydration   Pt is pulling out IV line. Drinking liquids  *  Dementia -patient seems to be on donepezil at home per chart review, we will order this when she is taking p.o.  *  Elevated LFT   This is due to Cholecystitis, Monitor.   All the records are reviewed and case discussed with Care Management/Social Workerr. Management plans discussed with the patient, family and they are in agreement.  CODE STATUS: DNR  TOTAL TIME TAKING CARE OF THIS PATIENT: 35 minutes.   May need home health.  POSSIBLE D/C IN 1-2 DAYS, DEPENDING ON CLINICAL CONDITION.   Vaughan Basta M.D on 10/03/2017   Between 7am to 6pm - Pager - [phone]  After 6pm go to [email or web address]  - password [email or web address] [facility]  Office  [phone]  CC: Primary care physician; Baxter Hire, MD  Note: This dictation was prepared with Dragon dictation along with smaller phrase technology. Any transcriptional errors that result from this process are unintentional.

## 2017-10-03 NOTE — Evaluation (Signed)
Physical Therapy Evaluation Patient Details Name: Brandi Nelson MRN: [id number] DOB: 07-26-1907 Today's Date: 10/03/2017   History of Present Illness  Pt is a 84 y.o. female who presents with chief complaint of chest pain and SOB.  Patient presented with epigastric pain, nausea and vomiting.  Her symptoms started couple of days ago.  In the ED she was found to have elevated transaminases.  On imaging she was found to have thickened gallbladder wall with gallstones and sludge and dilated CBD.  Hospitalist were called for admission.  Assessment includes: Cholecystitis with cholelithiasis, dehydration, dementia, and elevated LFT.      Clinical Impression  Pt presents with minor deficits in strength, transfers, gait, and balance but overall did well during the session.  Pt was able to perform sit to/from stand transfers with good eccentric and concentric control and stability.  Pt was able to amb 2 x 15' and 1 x 100' with HHA with slow cadence and occasional min instability that pt was able to self-correct with HHA.  Pt was conversational throughout ambulation with no signs of SOB or other adverse symptoms.  Pt's HR and SpO2 were WNL during the session checked pre and post ambulation.  Spoke at length to pt's son regarding recommendation for 24 hour supervision secondary to pt's level of cognition and elevated fall risk whether that be at home or with pt admitted to a facility. Pt will benefit from HHPT services upon discharge to safely address above deficits for decreased caregiver assistance, decreased risk of further functional decline, and decreased fall risk.        Follow Up Recommendations Home health PT;Supervision/Assistance - 24 hour    Equipment Recommendations  None recommended by PT    Recommendations for Other Services       Precautions / Restrictions Precautions Precautions: Fall Precaution Comments: HOH and very poor vision Restrictions Weight Bearing Restrictions: No       Mobility  Bed Mobility               General bed mobility comments: NT, pt in recliner  Transfers Overall transfer level: Needs assistance Equipment used: None Transfers: Sit to/from Stand Sit to Stand: Supervision         General transfer comment: Pt steady with good control during transfers  Ambulation/Gait Ambulation/Gait assistance: Min guard Gait Distance (Feet): 100 Feet x 1, 15 Feet x 2 Assistive device: 1 person hand held assist Gait Pattern/deviations: Step-through pattern;Decreased step length - right;Decreased step length - left Gait velocity: Decreased   General Gait Details: Slight assist to guide pt secondary to visual deficits; min deficits in stability that pt was able to self-correct with HHA.    Stairs            Wheelchair Mobility    Modified Rankin (Stroke Patients Only)       Balance Overall balance assessment: Mild deficits observed, not formally tested                                           Pertinent Vitals/Pain Pain Assessment: No/denies pain    Home Living Family/patient expects to be discharged to:: Private residence(Information provided by son secondary to pt's dementia) Living Arrangements: Alone Available Help at Discharge: Family;Personal care attendant;Available PRN/intermittently(Pt has two son's, one takes her to breakfast every morning and the other to lunch but pt is home alone for much  of the day.  A family friend spends the night with the pt occasionally. ) Type of Home: House Home Access: Level entry     Home Layout: One level Home Equipment: Environmental consultant - 2 wheels Additional Comments: Pt refuses to use any AD per son    Prior Function Level of Independence: Needs assistance   Gait / Transfers Assistance Needed: Ind amb in the home without AD, one fall in the last year, limited community ambulator with HHA   ADL's / Homemaking Assistance Needed: Assistance with ADLs from sons and niece,  sons drive pt to appointments and out to eat        Hand Dominance        Extremity/Trunk Assessment   Upper Extremity Assessment Upper Extremity Assessment: Overall WFL for tasks assessed    Lower Extremity Assessment Lower Extremity Assessment: Generalized weakness       Communication   Communication: HOH  Cognition Arousal/Alertness: Awake/alert Behavior During Therapy: WFL for tasks assessed/performed Overall Cognitive Status: History of cognitive impairments - at baseline                                        General Comments      Exercises     Assessment/Plan    PT Assessment Patient needs continued PT services  PT Problem List Decreased strength;Decreased balance       PT Treatment Interventions DME instruction;Gait training;Functional mobility training;Balance training;Therapeutic exercise;Therapeutic activities;Patient/family education    PT Goals (Current goals can be found in the Care Plan section)  Acute Rehab PT Goals Patient Stated Goal: To go home PT Goal Formulation: With patient Time For Goal Achievement: 10/16/17 Potential to Achieve Goals: Good    Frequency Min 2X/week   Barriers to discharge        Co-evaluation               AM-PAC PT "6 Clicks" Daily Activity  Outcome Measure Difficulty turning over in bed (including adjusting bedclothes, sheets and blankets)?: A Little Difficulty moving from lying on back to sitting on the side of the bed? : A Little Difficulty sitting down on and standing up from a chair with arms (e.g., wheelchair, bedside commode, etc,.)?: None Help needed moving to and from a bed to chair (including a wheelchair)?: A Little Help needed walking in hospital room?: A Little Help needed climbing 3-5 steps with a railing? : A Little 6 Click Score: 19    End of Session Equipment Utilized During Treatment: Gait belt Activity Tolerance: Patient tolerated treatment well Patient left: in  chair;with chair alarm set;with call bell/phone within reach;with family/visitor present Nurse Communication: Mobility status PT Visit Diagnosis: Unsteadiness on feet (R26.81);Muscle weakness (generalized) (M62.81);Difficulty in walking, not elsewhere classified (R26.2)    Time: 0998-3382 PT Time Calculation (min) (ACUTE ONLY): 38 min   Charges:   PT Evaluation $PT Eval Low Complexity: 1 Low PT Treatments $Gait Training: 8-22 mins        D. Royetta Asal PT, DPT 10/03/17, 12:58 PM

## 2017-10-03 NOTE — Progress Notes (Addendum)
SURGICAL PROGRESS NOTE (initial) - cpt: 89381  Patient seen and examined as described below with surgical PA-C, Ardell Isaacs.  Assessment/Plan: (ICD-10's: K85.10, K80.11) 84 y.o. 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              - monitor abdominal exam             - repeat/trend morning bilirubin (LFT's/CMP) and lipase  - antibiotics as per primary team, though no clear indication without choledocholithiasis  - okay with advancement to low fat diet, discuss with patient's son role of diet in recurrence and balancing with patient's happiness and willingness to eat low fat foods             - unlikely to be a surgical candidate considering age and chronic dementia, but medical risk stratification and optimization are clearly required prior to any consideration for surgery (again, not anticipated to be likely)             - if develops abdominal pain or signs of systemic illness and not safe for surgery, consider placement of percutaneous cholecystostomy drain for gallbladder decompression, though patient likely to attempt self-removal of drain, but again currently no indication for placement for percutaneous cholecystostomy draina             - currently no indication for emergent surgery or for cholecystostomy drain             - medical management of comorbidities per primary team             - GI consultation appreciated             - DVT prophylaxis  I have personally reviewed the patient's chart,  evaluated/examined the patient, proposed the recommended management, and discussed these recommendations with the patient's son to his expressed satisfaction as well as with patient's RN and ED physician.  Thank you for the opportunity to participate in this patient's care.  -- Marilynne Drivers Rosana Hoes, MD, [place]: [facility] - Partnering for exceptional care. Office: [phone]  [facility] Progress Note     Subjective: She is sitting in chair this morning with sons eating breakfast. Sons help contribute to history. No abdominal pain, tolerating clear liquids. No nausea, emesis, or fevers.   Objective: Vital signs in last 24 hours: Temp:  [97.5 F (36.4 C)-98.6 F (37 C)] 98 F (36.7 C) (09/25 0606) Pulse Rate:  [73-84] 73 (09/25 0606) Resp:  [16] 16 (09/25 0606) BP: (127-150)/(62-88) 150/88 (09/25 0606) SpO2:  [95 %-97 %] 97 % (09/25 0606)    Intake/Output from previous day: 09/24 0701 - 09/25 0700 In: 348.1 [I.V.:265.3; IV Piggyback:82.8] Out: 100 [Urine:100] Intake/Output this shift: No intake/output data recorded.  PE: Gen:  Alert, NAD, pleasant Pulm:  Normal effort, Abd: Soft, non-tender, non-distended Skin: warm and dry, no rashes  Psych: A&Ox3   Lab Results:  Recent Labs    10/02/17 0354 10/03/17 0505  WBC 14.7* 8.9  HGB 11.7* 11.7*  HCT 33.9* 32.8*  PLT 252 255   BMET Recent Labs    10/02/17 0354 10/03/17 0505  NA 143  144  K 3.8 3.6  CL 112* 113*  CO2 24 25  GLUCOSE 112* 79  BUN 20 23  CREATININE 1.02* 0.96  CALCIUM 8.1* 8.6*   PT/INR No results for input(s): LABPROT, INR in the last 72 hours. CMP     Component Value Date/Time   NA 144 10/03/2017 0505   NA 136 10/26/2012 1806   K 3.6 10/03/2017 0505   K 3.9 10/26/2012 1806   CL 113 (H) 10/03/2017 0505   CL 102 10/26/2012 1806   CO2 25 10/03/2017 0505   CO2 27 10/26/2012 1806   GLUCOSE 79 10/03/2017 0505   GLUCOSE 137 (H)  10/26/2012 1806   BUN 23 10/03/2017 0505   BUN 14 10/26/2012 1806   CREATININE 0.96 10/03/2017 0505   CREATININE 0.85 10/26/2012 1806   CALCIUM 8.6 (L) 10/03/2017 0505   CALCIUM 9.0 10/26/2012 1806   PROT 5.9 (L) 10/03/2017 0505   PROT 7.3 10/26/2012 1806   ALBUMIN 3.2 (L) 10/03/2017 0505   ALBUMIN 4.0 10/26/2012 1806   AST 96 (H) 10/03/2017 0505   AST 152 (H) 10/26/2012 1806   ALT 117 (H) 10/03/2017 0505   ALT 59 10/26/2012 1806   ALKPHOS 329 (H) 10/03/2017 0505   ALKPHOS 194 (H) 10/26/2012 1806   BILITOT 1.2 10/03/2017 0505   BILITOT 0.8 10/26/2012 1806   GFRNONAA 45 (L) 10/03/2017 0505   GFRNONAA 55 (L) 10/26/2012 1806   GFRAA 52 (L) 10/03/2017 0505   GFRAA >60 10/26/2012 1806   Lipase     Component Value Date/Time   LIPASE 174 (H) 10/03/2017 0505   LIPASE 135 10/26/2012 1806       Studies/Results: Dg Chest 2 View  Result Date: 10/01/2017 CLINICAL DATA:  Chest pain, shortness of breath EXAM: CHEST - 2 VIEW COMPARISON:  11/25/2016 FINDINGS: There is bilateral chronic interstitial thickening. There is no focal parenchymal opacity. There is no pleural effusion or pneumothorax. There is stable cardiomegaly. There is no acute osseous abnormality. There is an old healed left proximal humeral fracture. There is degenerative disc disease of the thoracic spine. IMPRESSION: No active cardiopulmonary disease. Electronically Signed   By: Kathreen Devoid   On: 10/01/2017 13:53   Mr 3d Recon At Scanner  Result Date: 10/02/2017 CLINICAL DATA:  Inpatient. Epigastric abdominal pain, nausea and vomiting. Cholelithiasis with dilated CBD on ultrasound. Abnormal liver function tests. EXAM: MRI ABDOMEN WITHOUT AND WITH CONTRAST (INCLUDING MRCP) TECHNIQUE: Multiplanar multisequence MR imaging of the abdomen was performed both before and after the administration of intravenous contrast. Heavily T2-weighted images of the biliary and pancreatic ducts were obtained, and three-dimensional MRCP images  were rendered by post processing. CONTRAST:  60mL MULTIHANCE GADOBENATE DIMEGLUMINE 529 MG/ML IV SOLN COMPARISON:  10/01/2017 abdominal sonogram. FINDINGS: Lower chest: Medial left lower lobe 8 mm pulmonary nodule (series 17/image 11), stable since 2012 CT abdomen study, considered benign. Hepatobiliary: Normal liver size and configuration. No hepatic steatosis. No liver mass. Layering sub 5 mm gallstones in the gallbladder. No significant gallbladder distention. Marked diffuse gallbladder wall thickening with a small amount of pericholecystic fluid. No significant intrahepatic biliary ductal dilatation. Mildly dilated common bile duct, unchanged since 12/14/2010 CT abdomen study. Common bile duct diameter 8 mm. No evidence of choledocholithiasis. Moderate periampullary duodenal diverticulum. Pancreas: No pancreatic mass or significant duct dilation. No pancreas divisum. Spleen: Normal size. No mass. Adrenals/Urinary Tract: Normal adrenals. No hydronephrosis. Small simple parapelvic renal cysts in both kidneys. No suspicious renal masses. Stomach/Bowel: Small hiatal hernia. Otherwise  normal nondistended stomach. Visualized small and large bowel is normal caliber, with no bowel wall thickening. Marked colonic diverticulosis. Vascular/Lymphatic: Atherosclerotic nonaneurysmal abdominal aorta. Patent portal, splenic, hepatic and renal veins. No pathologically enlarged lymph nodes in the abdomen. Other: Trace perihepatic ascites anteriorly. No focal fluid collection. Musculoskeletal: No aggressive appearing focal osseous lesions. IMPRESSION: 1. Cholelithiasis. Marked diffuse gallbladder wall thickening. Small amount of pericholecystic fluid. Acute cholecystitis cannot be excluded. 2. No significant intrahepatic biliary ductal dilatation. Chronic dilatation of the common bile duct (8 mm diameter), stable since 2012 CT abdomen study. No choledocholithiasis. Moderate periampullary duodenal diverticulum. 3. Small hiatal  hernia. 4. Marked colonic diverticulosis. 5.  Aortic Atherosclerosis (ICD10-I70.0). Electronically Signed   By: Ilona Sorrel M.D.   On: 10/02/2017 01:58   Mr Abdomen Mrcp Moise Boring Contast  Result Date: 10/02/2017 CLINICAL DATA:  Inpatient. Epigastric abdominal pain, nausea and vomiting. Cholelithiasis with dilated CBD on ultrasound. Abnormal liver function tests. EXAM: MRI ABDOMEN WITHOUT AND WITH CONTRAST (INCLUDING MRCP) TECHNIQUE: Multiplanar multisequence MR imaging of the abdomen was performed both before and after the administration of intravenous contrast. Heavily T2-weighted images of the biliary and pancreatic ducts were obtained, and three-dimensional MRCP images were rendered by post processing. CONTRAST:  73mL MULTIHANCE GADOBENATE DIMEGLUMINE 529 MG/ML IV SOLN COMPARISON:  10/01/2017 abdominal sonogram. FINDINGS: Lower chest: Medial left lower lobe 8 mm pulmonary nodule (series 17/image 11), stable since 2012 CT abdomen study, considered benign. Hepatobiliary: Normal liver size and configuration. No hepatic steatosis. No liver mass. Layering sub 5 mm gallstones in the gallbladder. No significant gallbladder distention. Marked diffuse gallbladder wall thickening with a small amount of pericholecystic fluid. No significant intrahepatic biliary ductal dilatation. Mildly dilated common bile duct, unchanged since 12/14/2010 CT abdomen study. Common bile duct diameter 8 mm. No evidence of choledocholithiasis. Moderate periampullary duodenal diverticulum. Pancreas: No pancreatic mass or significant duct dilation. No pancreas divisum. Spleen: Normal size. No mass. Adrenals/Urinary Tract: Normal adrenals. No hydronephrosis. Small simple parapelvic renal cysts in both kidneys. No suspicious renal masses. Stomach/Bowel: Small hiatal hernia. Otherwise normal nondistended stomach. Visualized small and large bowel is normal caliber, with no bowel wall thickening. Marked colonic diverticulosis. Vascular/Lymphatic:  Atherosclerotic nonaneurysmal abdominal aorta. Patent portal, splenic, hepatic and renal veins. No pathologically enlarged lymph nodes in the abdomen. Other: Trace perihepatic ascites anteriorly. No focal fluid collection. Musculoskeletal: No aggressive appearing focal osseous lesions. IMPRESSION: 1. Cholelithiasis. Marked diffuse gallbladder wall thickening. Small amount of pericholecystic fluid. Acute cholecystitis cannot be excluded. 2. No significant intrahepatic biliary ductal dilatation. Chronic dilatation of the common bile duct (8 mm diameter), stable since 2012 CT abdomen study. No choledocholithiasis. Moderate periampullary duodenal diverticulum. 3. Small hiatal hernia. 4. Marked colonic diverticulosis. 5.  Aortic Atherosclerosis (ICD10-I70.0). Electronically Signed   By: Ilona Sorrel M.D.   On: 10/02/2017 01:58   US Abdomen Limited Ruq  Result Date: 10/01/2017 CLINICAL DATA:  Abnormal LFTs. EXAM: ULTRASOUND ABDOMEN LIMITED RIGHT UPPER QUADRANT COMPARISON:  None. FINDINGS: Gallbladder: Moderate cholelithiasis with sludge and moderate gallbladder wall thickening measuring up to 1.2 cm. Negative sonographic Murphy sign. Largest stone measures 7 mm. No adjacent free fluid. Common bile duct: Diameter: 9 mm. Liver: Suggestion of echogenic portal triads over the left lobe without focal mass. Portal vein is patent on color Doppler imaging with normal direction of blood flow towards the liver. IMPRESSION: Moderate cholelithiasis with sludge and moderate gallbladder wall thickening. Upper normal diameter of the common bile duct measuring 9 mm. Findings may be seen with acute  cholecystitis. Mild increased echogenicity of the portal triads over the left lobe of the liver which can be seen with hepatitis. Electronically Signed   By: Marin Olp M.D.   On: 10/01/2017 19:39    Anti-infectives: Anti-infectives (From admission, onward)   Start     Dose/Rate Route Frequency Ordered Stop   10/02/17 0300   meropenem (MERREM) 500 mg in sodium chloride 0.9 % 100 mL IVPB     500 mg 200 mL/hr over 30 Minutes Intravenous Every 12 hours 10/01/17 2149     10/01/17 2015  piperacillin-tazobactam (ZOSYN) IVPB 3.375 g     3.375 g 100 mL/hr over 30 Minutes Intravenous  Once 10/01/17 2011 10/01/17 2132       Assessment/Plan  Cholelithiasis - She continues to show clinical improvement this morning. No abdominal pain, tolerating a diet, afebrile, labs improved this morning. Suspect she passed a gallstone causing transient elevation in LFTs, T Bili, and Lipase. Although she has an indication for surgery, she is not a good surgical candidate given advanced age and chronic dementia - Labs improved this morning, will continue to follow while in the hospital - Pulled out IVF, okay to leave out for now as she is drinking - Continue on clear liquids for now  - Will mobilize with PT - DVT Prophylaxis   LOS: 2 days    Edison Simon , PA-C [facility] 10/03/2017, 9:41 AM [phone] M-F: 7am - 4pm

## 2017-10-03 NOTE — Progress Notes (Signed)
Jonathon Bellows , MD [address], [place], [place], [place], [address] [address], [place], [place], [place], [address] Phone: [phone]  Fax: [phone]   Brandi Nelson is being followed for Gall stone pancreatitis  Day 1 of follow up   Subjective: No pain sitting comfortably in chair and tolerated a liquid diet    Objective: Vital signs in last 24 hours: Vitals:   10/02/17 1326 10/02/17 2020 10/02/17 2100 10/03/17 0606  BP: 127/62 (!) 144/82 (!) 144/82 (!) 150/88  Pulse: 84 84 84 73  Resp: 16 16 16 16   Temp: 98.6 F (37 C) (!) 97.5 F (36.4 C) (!) 97.5 F (36.4 C) 98 F (36.7 C)  TempSrc: Axillary Oral Oral Oral  SpO2: 95% 96% 96% 97%  Weight:      Height:       Weight change:   Intake/Output Summary (Last 24 hours) at 10/03/2017 1124 Last data filed at 10/03/2017 1028 Gross per 24 hour  Intake 588.09 ml  Output 0 ml  Net 588.09 ml     Exam: Heart:: Regular rate and rhythm, S1S2 present or without murmur or extra heart sounds Lungs: normal, clear to auscultation and clear to auscultation and percussion Abdomen: soft, nontender, normal bowel sounds   Lab Results: @LABTEST2 @ Micro Results: No results found for this or any previous visit (from the past 240 hour(s)). Studies/Results: Dg Chest 2 View  Result Date: 10/01/2017 CLINICAL DATA:  Chest pain, shortness of breath EXAM: CHEST - 2 VIEW COMPARISON:  11/25/2016 FINDINGS: There is bilateral chronic interstitial thickening. There is no focal parenchymal opacity. There is no pleural effusion or pneumothorax. There is stable cardiomegaly. There is no acute osseous abnormality. There is an old healed left proximal humeral fracture. There is degenerative disc disease of the thoracic spine. IMPRESSION: No active cardiopulmonary disease. Electronically Signed   By: Kathreen Devoid   On: 10/01/2017 13:53   Mr 3d Recon At Scanner  Result Date: 10/02/2017 CLINICAL DATA:  Inpatient. Epigastric abdominal pain,  nausea and vomiting. Cholelithiasis with dilated CBD on ultrasound. Abnormal liver function tests. EXAM: MRI ABDOMEN WITHOUT AND WITH CONTRAST (INCLUDING MRCP) TECHNIQUE: Multiplanar multisequence MR imaging of the abdomen was performed both before and after the administration of intravenous contrast. Heavily T2-weighted images of the biliary and pancreatic ducts were obtained, and three-dimensional MRCP images were rendered by post processing. CONTRAST:  26mL MULTIHANCE GADOBENATE DIMEGLUMINE 529 MG/ML IV SOLN COMPARISON:  10/01/2017 abdominal sonogram. FINDINGS: Lower chest: Medial left lower lobe 8 mm pulmonary nodule (series 17/image 11), stable since 2012 CT abdomen study, considered benign. Hepatobiliary: Normal liver size and configuration. No hepatic steatosis. No liver mass. Layering sub 5 mm gallstones in the gallbladder. No significant gallbladder distention. Marked diffuse gallbladder wall thickening with a small amount of pericholecystic fluid. No significant intrahepatic biliary ductal dilatation. Mildly dilated common bile duct, unchanged since 12/14/2010 CT abdomen study. Common bile duct diameter 8 mm. No evidence of choledocholithiasis. Moderate periampullary duodenal diverticulum. Pancreas: No pancreatic mass or significant duct dilation. No pancreas divisum. Spleen: Normal size. No mass. Adrenals/Urinary Tract: Normal adrenals. No hydronephrosis. Small simple parapelvic renal cysts in both kidneys. No suspicious renal masses. Stomach/Bowel: Small hiatal hernia. Otherwise normal nondistended stomach. Visualized small and large bowel is normal caliber, with no bowel wall thickening. Marked colonic diverticulosis. Vascular/Lymphatic: Atherosclerotic nonaneurysmal abdominal aorta. Patent portal, splenic, hepatic and renal veins. No pathologically enlarged lymph nodes in the abdomen. Other: Trace perihepatic ascites anteriorly. No focal fluid collection.  Musculoskeletal: No aggressive appearing focal  osseous lesions. IMPRESSION: 1. Cholelithiasis. Marked diffuse gallbladder wall thickening. Small amount of pericholecystic fluid. Acute cholecystitis cannot be excluded. 2. No significant intrahepatic biliary ductal dilatation. Chronic dilatation of the common bile duct (8 mm diameter), stable since 2012 CT abdomen study. No choledocholithiasis. Moderate periampullary duodenal diverticulum. 3. Small hiatal hernia. 4. Marked colonic diverticulosis. 5.  Aortic Atherosclerosis (ICD10-I70.0). Electronically Signed   By: Ilona Sorrel M.D.   On: 10/02/2017 01:58   Mr Abdomen Mrcp Moise Boring Contast  Result Date: 10/02/2017 CLINICAL DATA:  Inpatient. Epigastric abdominal pain, nausea and vomiting. Cholelithiasis with dilated CBD on ultrasound. Abnormal liver function tests. EXAM: MRI ABDOMEN WITHOUT AND WITH CONTRAST (INCLUDING MRCP) TECHNIQUE: Multiplanar multisequence MR imaging of the abdomen was performed both before and after the administration of intravenous contrast. Heavily T2-weighted images of the biliary and pancreatic ducts were obtained, and three-dimensional MRCP images were rendered by post processing. CONTRAST:  43mL MULTIHANCE GADOBENATE DIMEGLUMINE 529 MG/ML IV SOLN COMPARISON:  10/01/2017 abdominal sonogram. FINDINGS: Lower chest: Medial left lower lobe 8 mm pulmonary nodule (series 17/image 11), stable since 2012 CT abdomen study, considered benign. Hepatobiliary: Normal liver size and configuration. No hepatic steatosis. No liver mass. Layering sub 5 mm gallstones in the gallbladder. No significant gallbladder distention. Marked diffuse gallbladder wall thickening with a small amount of pericholecystic fluid. No significant intrahepatic biliary ductal dilatation. Mildly dilated common bile duct, unchanged since 12/14/2010 CT abdomen study. Common bile duct diameter 8 mm. No evidence of choledocholithiasis. Moderate periampullary duodenal diverticulum. Pancreas: No pancreatic mass or significant duct  dilation. No pancreas divisum. Spleen: Normal size. No mass. Adrenals/Urinary Tract: Normal adrenals. No hydronephrosis. Small simple parapelvic renal cysts in both kidneys. No suspicious renal masses. Stomach/Bowel: Small hiatal hernia. Otherwise normal nondistended stomach. Visualized small and large bowel is normal caliber, with no bowel wall thickening. Marked colonic diverticulosis. Vascular/Lymphatic: Atherosclerotic nonaneurysmal abdominal aorta. Patent portal, splenic, hepatic and renal veins. No pathologically enlarged lymph nodes in the abdomen. Other: Trace perihepatic ascites anteriorly. No focal fluid collection. Musculoskeletal: No aggressive appearing focal osseous lesions. IMPRESSION: 1. Cholelithiasis. Marked diffuse gallbladder wall thickening. Small amount of pericholecystic fluid. Acute cholecystitis cannot be excluded. 2. No significant intrahepatic biliary ductal dilatation. Chronic dilatation of the common bile duct (8 mm diameter), stable since 2012 CT abdomen study. No choledocholithiasis. Moderate periampullary duodenal diverticulum. 3. Small hiatal hernia. 4. Marked colonic diverticulosis. 5.  Aortic Atherosclerosis (ICD10-I70.0). Electronically Signed   By: Ilona Sorrel M.D.   On: 10/02/2017 01:58   US Abdomen Limited Ruq  Result Date: 10/01/2017 CLINICAL DATA:  Abnormal LFTs. EXAM: ULTRASOUND ABDOMEN LIMITED RIGHT UPPER QUADRANT COMPARISON:  None. FINDINGS: Gallbladder: Moderate cholelithiasis with sludge and moderate gallbladder wall thickening measuring up to 1.2 cm. Negative sonographic Murphy sign. Largest stone measures 7 mm. No adjacent free fluid. Common bile duct: Diameter: 9 mm. Liver: Suggestion of echogenic portal triads over the left lobe without focal mass. Portal vein is patent on color Doppler imaging with normal direction of blood flow towards the liver. IMPRESSION: Moderate cholelithiasis with sludge and moderate gallbladder wall thickening. Upper normal diameter of  the common bile duct measuring 9 mm. Findings may be seen with acute cholecystitis. Mild increased echogenicity of the portal triads over the left lobe of the liver which can be seen with hepatitis. Electronically Signed   By: Marin Olp M.D.   On: 10/01/2017 19:39   Medications: I have reviewed the patient's current medications. Scheduled Meds: .  heparin injection (subcutaneous)  5,000 Units Subcutaneous Q8H  . QUEtiapine  12.5 mg Oral Once   Continuous Infusions: . meropenem (MERREM) IV 500 mg (10/03/17 0303)   PRN Meds:.acetaminophen **OR** acetaminophen, ondansetron **OR** ondansetron (ZOFRAN) IV, traMADol   Assessment: Principal Problem:   Cholecystitis with cholelithiasis Active Problems:   Dehydration   Dementia  Brandi Nelson is a 84 y.o. y/o female admitted with acute onset abdominal pain abnormal liver function tests.  She does have gallstones on imaging.  The pattern of her liver function tests suggest very likely she passed a gallstone.Also likely had gall stone pancreatitis as she had elevated Lipase. LFT's improving. No further GI recommendations. Surgery following and have discussed that she is high risk for cholecystectomy . Continue IV fluids and gradually advance diet as tolerated. She wants a soft diet , rather she wants eggs. I think a low fat soft diet is a good start today.   I will sign off.  Please call me if any further GI concerns or questions.  We would like to thank you for the opportunity to participate in the care of Noralee Space.      LOS: 2 days   Jonathon Bellows, MD 10/03/2017, 11:24 AM

## 2017-10-03 NOTE — Consult Note (Signed)
Consultation Note Date: 10/03/2017   Patient Name: Brandi Nelson  DOB: 01/20/1907  MRN: [id number]  Age / Sex: 84 y.o., female  PCP: Brandi Hire, MD Referring Physician: Vaughan Nelson, *  Reason for Consultation: Establishing goals of care and Psychosocial/spiritual support  HPI/Patient Profile: 84 y.o. female   admitted on 10/01/2017 with  acute onset abdominal pain abnormal liver function tests. She does have gallstones on imaging. The pattern of her liver function tests suggest very likely she passed a gallstone.  Also likely had gall stone pancreatitis as she had elevated Lipase. LFT's improving.  Currently no further complaints of pain.  Per GI no surgical or further GI recommendations.  Low-fat soft diet recommended  She continues to live in her own home with the support of her family and out-of-pocket caregivers.  She is legally blind and very hard of hearing.  Family face treatment option decisions, advanced directive decisions and anticipatory care needs.  Clinical Assessment and Goals of Care:   This NP Brandi Nelson reviewed medical records, received report from team, assessed the patient and then meet at the patient's bedside along with her son/Brandi/HPOA  to discuss diagnosis, prognosis, GOC, EOL wishes disposition and options.  Concept of Hospice and Palliative Care were discussed  A  discussion was had today regarding advanced directives.  Concepts specific to code status, artifical feeding and hydration, continued IV antibiotics and rehospitalization was had.  The difference between a aggressive medical intervention path  and a palliative comfort care path for this patient at this time was had.  Values and goals of care important to patient and family were attempted to be elicited.  Patient's son and I discussed the importance of comfort, quality and dignity at this time and  Brandi Nelson life and a commonsense approach to health care.    MOST form introduced  Natural trajectory and expectations at EOL were discussed.  Questions and concerns addressed.   Family encouraged to call with questions or concerns.    PMT will continue to support holistically.   HCPOA/Brandi Nelson/ son    SUMMARY OF RECOMMENDATIONS    Code Status/Advance Care Planning:  DNR   Palliative Prophylaxis:   Aspiration, Bowel Regimen, Delirium Protocol, Frequent Pain Assessment and Oral Care  Additional Recommendations (Limitations, Scope, Preferences):  Avoid Hospitalization  Psycho-social/Spiritual:   Desire for further Chaplaincy support:no  Additional Recommendations: Education on Hospice   She will support offered to [place].  Created space and opportunity for him to voice his thoughts and feelings and concerns regarding current medical situation for his mother.  He verbalizes that he feels "most of this falls on me"  Prognosis:   Unable to determine  Discharge Planning:  Patient's son verbalized his concerns over anticipatory care needs in the future.  It is just he and his brother who support Brandi Nelson at home.  She continues to slowly decline physically, functionally and cognitively.  Again patient is legally blind and very hard of hearing.  At this time he worries that  funds are running low that are supporting the out-of-pocket caregivers.  What the family very much wants to avoid is having the patient moved from her home at this time in her life "she is almost 89".  Recommend community-based palliative services on discharge.   Home with Home Health     Primary Diagnoses: Present on Admission: . Cholecystitis with cholelithiasis . Dehydration . Dementia   I have reviewed the medical record, interviewed the patient and family, and examined the patient. The following aspects are pertinent.  Past Medical History:  Diagnosis Date  . Asthma   . Cancer ([place])      breast  . Dementia   . Hearing loss   . Macular degeneration    Social History   Socioeconomic History  . Marital status: Widowed    Spouse name: Not on file  . Number of children: Not on file  . Years of education: Not on file  . Highest education level: Not on file  Occupational History  . Not on file  Social Needs  . Financial resource strain: Not on file  . Food insecurity:    Worry: Not on file    Inability: Not on file  . Transportation needs:    Medical: Not on file    Non-medical: Not on file  Tobacco Use  . Smoking status: Never Smoker  . Smokeless tobacco: Never Used  Substance and Sexual Activity  . Alcohol use: No  . Drug use: Not on file  . Sexual activity: Not on file  Lifestyle  . Physical activity:    Days per week: Not on file    Minutes per session: Not on file  . Stress: Not on file  Relationships  . Social connections:    Talks on phone: Not on file    Gets together: Not on file    Attends religious service: Not on file    Active member of club or organization: Not on file    Attends meetings of clubs or organizations: Not on file    Relationship status: Not on file  Other Topics Concern  . Not on file  Social History Narrative   Lives at home by herself, family checks on her.   Does not use any walking assisted devices.   Family History  Problem Relation Age of Onset  . Heart attack Mother   . Heart attack Father    Scheduled Meds: . amoxicillin-clavulanate  600 mg of amoxicillin Oral BID  . heparin injection (subcutaneous)  5,000 Units Subcutaneous Q8H  . QUEtiapine  12.5 mg Oral Once   Continuous Infusions: . meropenem (MERREM) IV 500 mg (10/03/17 0303)   PRN Meds:.acetaminophen **OR** acetaminophen, ondansetron **OR** ondansetron (ZOFRAN) IV, traMADol Medications Prior to Admission:  Prior to Admission medications   Not on File   Allergies  Allergen Reactions  . Penicillin G     Has patient had a PCN reaction causing  immediate rash, facial/tongue/throat swelling, SOB or lightheadedness with hypotension: no  Has patient had a PCN reaction causing severe rash involving mucus membranes or skin necrosis: no Has patient had a PCN reaction that required hospitalization no Has patient had a PCN reaction occurring within the last 10 years: no If all of the above answers are "NO", then may proceed with Cephalosporin use.    Review of Systems  Unable to perform ROS: Dementia    Physical Exam  Constitutional:  -Pale elderly female, pleasantly confused  Cardiovascular: Normal rate and regular rhythm.  Pulmonary/Chest: Effort normal and breath sounds normal.  Neurological: She is alert.  Skin: Skin is warm and dry.    Vital Signs: BP (!) 147/81 (BP Location: Left Wrist)   Pulse 88   Temp 98.1 F (36.7 C)   Resp 15   Ht 4\' 11"  (1.499 m)   Wt 58.9 kg   SpO2 93%   BMI 26.24 kg/m  Pain Scale: 0-10   Pain Score: 0-No pain   SpO2: SpO2: 93 % O2 Device:SpO2: 93 % O2 Flow Rate: .   IO: Intake/output summary:   Intake/Output Summary (Last 24 hours) at 10/03/2017 1428 Last data filed at 10/03/2017 1142 Gross per 24 hour  Intake 322.76 ml  Output 300 ml  Net 22.76 ml    LBM:   Baseline Weight: Weight: 58.9 kg Most recent weight: Weight: 58.9 kg     Palliative Assessment/Data: 50%   Discussed with Dr. Anselm Jungling  Time In: 1415 Time Out: 1530 Time Total: 75 minutes Greater than 50%  of this time was spent counseling and coordinating care related to the above assessment and plan.  Signed by: Brandi Lessen, NP   Please contact Palliative Medicine Team phone at [phone] for questions and concerns.  For individual provider: See Shea Evans

## 2017-10-04 DIAGNOSIS — Z66 Do not resuscitate: Secondary | ICD-10-CM

## 2017-10-04 DIAGNOSIS — Z515 Encounter for palliative care: Secondary | ICD-10-CM

## 2017-10-04 DIAGNOSIS — R531 Weakness: Secondary | ICD-10-CM

## 2017-10-04 LAB — COMPREHENSIVE METABOLIC PANEL
ALBUMIN: 3.4 g/dL — AB (ref 3.5–5.0)
ALT: 79 U/L — ABNORMAL HIGH (ref 0–44)
AST: 42 U/L — AB (ref 15–41)
Alkaline Phosphatase: 269 U/L — ABNORMAL HIGH (ref 38–126)
Anion gap: 9 (ref 5–15)
BILIRUBIN TOTAL: 1 mg/dL (ref 0.3–1.2)
BUN: 21 mg/dL (ref 8–23)
CO2: 23 mmol/L (ref 22–32)
Calcium: 8.6 mg/dL — ABNORMAL LOW (ref 8.9–10.3)
Chloride: 110 mmol/L (ref 98–111)
Creatinine, Ser: 0.82 mg/dL (ref 0.44–1.00)
GFR calc Af Amer: >60 mL/min (ref >60)
GFR calc non Af Amer: 54 mL/min — ABNORMAL LOW (ref >60)
GLUCOSE: 75 mg/dL (ref 70–99)
POTASSIUM: 3.6 mmol/L (ref 3.5–5.1)
SODIUM: 142 mmol/L (ref 135–145)
Total Protein: 6 g/dL — ABNORMAL LOW (ref 6.5–8.1)

## 2017-10-04 LAB — LIPASE, BLOOD: Lipase: 83 U/L — ABNORMAL HIGH (ref 11–51)

## 2017-10-04 LAB — CBC
HEMATOCRIT: 33.2 % — AB (ref 35.0–47.0)
Hemoglobin: 11.6 g/dL — ABNORMAL LOW (ref 12.0–16.0)
MCH: 30.8 pg (ref 26.0–34.0)
MCHC: 34.8 g/dL (ref 32.0–36.0)
MCV: 88.7 fL (ref 80.0–100.0)
Platelets: 275 10*3/uL (ref 150–440)
RBC: 3.75 MIL/uL — ABNORMAL LOW (ref 3.80–5.20)
RDW: 15.7 % — AB (ref 11.5–14.5)
WBC: 7.2 10*3/uL (ref 3.6–11.0)

## 2017-10-04 NOTE — Progress Notes (Signed)
New referral for outpatient Palliative to follow at home received from [facility]. Patient to discharge today with [facility]. Patient information faxed to referral. Flo Shanks RN, BSN, [facility], hospital Liaison [phone]

## 2017-10-04 NOTE — Progress Notes (Signed)
Discharge order received. Patient is alert and oriented. Vital signs stable . No signs of acute distress. Discharge instructions given to son. Patient' son  verbalized understanding. No other issues noted at this time.

## 2017-10-04 NOTE — Care Management Note (Signed)
Case Management Note  Patient Details  Name: Brandi Nelson MRN: [id number] Date of Birth: 1907-11-04   Patient presents with presents with epigastric pain, nausea and vomiting.  Patient lives at home alone for a portion of the day.  One son picks her up each morning and takes her to breakfast.  Her son [name] [phone]) picks her up each day and takes her to lunch.  A neighbor comes over at 7pm spends the night, and leaves at 6am.  Patient has a RW and BSC in the home, however son states that she does not utilize them. PT has assessed patient and recommends home health.  Son agreeable and has chosen [facility].  Patient is legally blind, and hard of hearing.  Son requests to be called for home visits.  Corene Cornea with [facility] has been notified.  Outpatient palliative has been recommend and son agreeable.  Referral made to [facility] with [facility] [place].   RNCM signing off.     Subjective/Objective:                    Action/Plan:   Expected Discharge Date:  10/04/17               Expected Discharge Plan:     In-House Referral:     Discharge planning Services     Post Acute Care Choice:    Choice offered to:     DME Arranged:    DME Agency:     HH Arranged:    HH Agency:     Status of Service:     If discussed at [employer], dates discussed:    Additional Comments:  Beverly Sessions, RN 10/04/2017, 11:17 AM

## 2017-10-04 NOTE — Discharge Instructions (Signed)
Follow-up with primary care physician in 3 to 5 days Follow-up with surgery as needed

## 2017-10-04 NOTE — Progress Notes (Addendum)
SURGICAL PROGRESS NOTE(initial) - cpt: 45809  Patient tolerating diet without any further evidence of abdominal pain or signs of systemic illness. Exam as described below by Otho Ket, PA-C.  Assessment/Plan: (ICD-10's:K85.10,K80.11) 84 y.o.femalewith resolved epigastric abdominal pain and improving abnormal LFT's and lipase attributable to gallstone pancreatitis, complicated by what appears to be asymptomatic chronic cholecystitis, considering patient's lack of symptoms (resolved epigastric pain attributable to pancreatitis) and by comorbidities includingadvanced chronological age, advanced/severe chronic dementia, COPD (not on home supplemental oxygen therapy), history of breast cancer, hearing loss, and near-blindness attributable to macular degeneration.  - monitor abdominal exam             - antibiotics as per primary team, though no clear indication             - continue low fat diet, though discussed with patient's son balancing risk of recurrent gallstone pancreatitis, choledocholithiasis, or similar with patient's happiness and willingness to eat low fat foods - unlikely to be a surgical candidate considering age and chronic dementia, but medical risk stratification and optimization are clearly required prior to any consideration for surgery (again, not anticipated to be likely) - if develops abdominal pain or signs of systemic illness and not safe for surgery, consider placement of percutaneous cholecystostomy drain for gallbladder decompression, though patient is likely to attempt self-removal of drain (again, currently no indication for placement for percutaneous cholecystostomy drain) - currently no indication for emergent surgery or for cholecystostomy drain - medical management of comorbidities per primary team - agree with discharge planning  Thank you for the opportunity to participate in this  patient's care.  -- Marilynne Drivers Rosana Hoes, MD, [place]: [facility] - Partnering for exceptional care. Office: [phone]  [facility] Progress Note     Subjective: She is sitting up in her chair this morning eating breakfast. Son helps obtain history. No complaints of abdominal pain, nausea, or emesis. She has tolerated a diet without adverse event. Mobilizing.   Objective: Vital signs in last 24 hours: Temp:  [97.6 F (36.4 C)-98.7 F (37.1 C)] 97.6 F (36.4 C) (09/26 0600) Pulse Rate:  [79-88] 79 (09/26 0600) Resp:  [15-20] 20 (09/26 0600) BP: (146-166)/(76-85) 146/76 (09/26 0600) SpO2:  [93 %-99 %] 99 % (09/25 2036)    Intake/Output from previous day: 09/25 0701 - 09/26 0700 In: 240 [P.O.:240] Out: 600 [Urine:600] Intake/Output this shift: Total I/O In: 240 [P.O.:240] Out: 400 [Urine:400]  PE: Gen:  Alert, NAD, pleasant Pulm:  Normal effort Abd: Soft, non-tender, non-distended Skin: warm and dry, no rashes  Psych: A&Ox3   Lab Results:  Recent Labs    10/03/17 0505 10/04/17 0644  WBC 8.9 7.2  HGB 11.7* 11.6*  HCT 32.8* 33.2*  PLT 255 275   BMET Recent Labs    10/03/17 0505 10/04/17 0644  NA 144 142  K 3.6 3.6  CL 113* 110  CO2 25 23  GLUCOSE 79 75  BUN 23 21  CREATININE 0.96 0.82  CALCIUM 8.6* 8.6*   PT/INR No results for input(s): LABPROT, INR in the last 72 hours. CMP     Component Value Date/Time   NA 142 10/04/2017 0644   NA 136 10/26/2012 1806   K 3.6 10/04/2017 0644   K 3.9 10/26/2012 1806   CL 110 10/04/2017 0644   CL 102 10/26/2012 1806   CO2 23 10/04/2017 0644   CO2 27 10/26/2012 1806   GLUCOSE 75 10/04/2017 0644   GLUCOSE 137 (  H) 10/26/2012 1806   BUN 21 10/04/2017 0644   BUN 14 10/26/2012 1806   CREATININE 0.82 10/04/2017 0644   CREATININE 0.85 10/26/2012 1806   CALCIUM 8.6 (L) 10/04/2017 0644   CALCIUM 9.0 10/26/2012 1806   PROT 6.0 (L) 10/04/2017 0644   PROT  7.3 10/26/2012 1806   ALBUMIN 3.4 (L) 10/04/2017 0644   ALBUMIN 4.0 10/26/2012 1806   AST 42 (H) 10/04/2017 0644   AST 152 (H) 10/26/2012 1806   ALT 79 (H) 10/04/2017 0644   ALT 59 10/26/2012 1806   ALKPHOS 269 (H) 10/04/2017 0644   ALKPHOS 194 (H) 10/26/2012 1806   BILITOT 1.0 10/04/2017 0644   BILITOT 0.8 10/26/2012 1806   GFRNONAA 54 (L) 10/04/2017 0644   GFRNONAA 55 (L) 10/26/2012 1806   GFRAA >60 10/04/2017 0644   GFRAA >60 10/26/2012 1806   Lipase     Component Value Date/Time   LIPASE 83 (H) 10/04/2017 0644   LIPASE 135 10/26/2012 1806       Studies/Results: No results found.  Anti-infectives: Anti-infectives (From admission, onward)   Start     Dose/Rate Route Frequency Ordered Stop   10/03/17 1430  amoxicillin-clavulanate (AUGMENTIN) 400-57 MG/5ML suspension 600 mg     600 mg of amoxicillin Oral 2 times daily 10/03/17 1422     10/02/17 0300  meropenem (MERREM) 500 mg in sodium chloride 0.9 % 100 mL IVPB  Status:  Discontinued     500 mg 200 mL/hr over 30 Minutes Intravenous Every 12 hours 10/01/17 2149 10/03/17 1509   10/01/17 2015  piperacillin-tazobactam (ZOSYN) IVPB 3.375 g     3.375 g 100 mL/hr over 30 Minutes Intravenous  Once 10/01/17 2011 10/01/17 2132       Assessment/Plan  Cholelithiasis - Her labs continue to improve to near normal levels this morning, WBC normalized. Likely passed a gallstone which was the cause of her symptoms and transient elevation in labs.  - Although she carries an indication for surgery, she is a high risk candidate and surgery is unlikely on this admission. If she were to acutely worsen she may benefit from a percutaneous cholecystotomy tube, again unlikely.  - Tolerating a full diet - At this time, surgery will sign off and provide follow up as needed. - Disposition from medicine team, appreciate their help.     LOS: 3 days    Edison Simon , PA-C [facility] 10/04/2017, 10:28  AM [phone] M-F: 7am - 4pm

## 2017-10-04 NOTE — Care Management Important Message (Signed)
Patient discharged prior to my arrival to floor to deliver concurrent Medicare IM.   

## 2017-10-04 NOTE — Discharge Summary (Addendum)
[place] [facility] [place] NAME: Brandi Nelson    MR#:  [id number]  DATE OF BIRTH:  [birth year]  DATE OF ADMISSION:  10/01/2017 ADMITTING PHYSICIAN: Dustin Flock, MD  DATE OF DISCHARGE:  10/04/17 PRIMARY CARE PHYSICIAN: Baxter Hire, MD    ADMISSION DIAGNOSIS:  Shortness of breath [R06.02] Gallbladder disease [K82.9] Abnormal liver function test [R94.5] Abnormal LFTs [R94.5] Chest pain, unspecified type [R07.9]  DISCHARGE DIAGNOSIS:  Principal Problem:   Cholecystitis with cholelithiasis Active Problems:   Dehydration   Dementia   Palliative care by specialist   DNR (do not resuscitate)   Weakness generalized   SECONDARY DIAGNOSIS:   Past Medical History:  Diagnosis Date  . Asthma   . Cancer ([place])    breast  . Dementia   . Hearing loss   . Macular degeneration     HOSPITAL COURSE:  HPI  Brandi Nelson  is a 84 y.o. female who presents with chief complaint as above.  Patient presents with epigastric pain, nausea and vomiting.  His symptoms started couple of days ago.  Here in the ED she was found to have elevated transaminases.  On imaging she was found to have thickened gallbladder wall with gallstones and sludge and dilated CBD.  Hospitalist were called for admission  *Cholecystitis with cholelithiasis -prob passsed a gall stone Clinically improved with IV antibiotics, d/c abx  GI consult, MRCP done, no need for ERCP. Appreciated surgical consult, no need for surgry now as pt have no pain, d/c  Abx and Fluids.  Pt pulled out IV line    * Acute pancreatitis   Resolving, no pain now.   Patient is tolerating advanced soft diet.  Discharge home okay with surgery  *Dehydration -IV fluids for hydration   Pt is pulling out IV line.  Encouraged patient p.o. fluid intake  *Dementia -patient seems to be on donepezil at home per chart review,   *  Elevated LFT   This is due to Cholecystitis, PCP to monitor,  probably patient passed a gallstone Outpatient follow-up with surgery as needed\   Discharge home with home health PT with 24-hour supervision Son is in the room and agreeable to take patient home as per patient's request.  Resume home meds if she takes any.  Records state that patient is not on any home medications  Op palliative care  DISCHARGE CONDITIONS:   stable  CONSULTS OBTAINED:  Treatment Team:  Vickie Epley, MD   PROCEDURES none  DRUG ALLERGIES:   Allergies  Allergen Reactions  . Penicillin G     Has patient had a PCN reaction causing immediate rash, facial/tongue/throat swelling, SOB or lightheadedness with hypotension: no  Has patient had a PCN reaction causing severe rash involving mucus membranes or skin necrosis: no Has patient had a PCN reaction that required hospitalization no Has patient had a PCN reaction occurring within the last 10 years: no If all of the above answers are "NO", then may proceed with Cephalosporin use.     DISCHARGE MEDICATIONS:   Allergies as of 10/04/2017      Reactions   Penicillin G    Has patient had a PCN reaction causing immediate rash, facial/tongue/throat swelling, SOB or lightheadedness with hypotension: no Has patient had a PCN reaction causing severe rash involving mucus membranes or skin necrosis: no Has patient had a PCN reaction that required hospitalization no Has patient had a PCN reaction occurring within the last 10 years:  no If all of the above answers are "NO", then may proceed with Cephalosporin use.      Medication List    You have not been prescribed any medications.      DISCHARGE INSTRUCTIONS:   Follow-up with primary care physician in 3 to 5 days Follow-up with surgery as needed Op palliative care   DIET:  Low fat, Low cholesterol diet  DISCHARGE CONDITION:  Fair  ACTIVITY:  Activity as tolerated  OXYGEN:  Home Oxygen: No.   Oxygen Delivery: room air  DISCHARGE LOCATION:  home    If you experience worsening of your admission symptoms, develop shortness of breath, life threatening emergency, suicidal or homicidal thoughts you must seek medical attention immediately by calling 911 or calling your MD immediately  if symptoms less severe.  You Must read complete instructions/literature along with all the possible adverse reactions/side effects for all the Medicines you take and that have been prescribed to you. Take any new Medicines after you have completely understood and accpet all the possible adverse reactions/side effects.   Please note  You were cared for by a hospitalist during your hospital stay. If you have any questions about your discharge medications or the care you received while you were in the hospital after you are discharged, you can call the unit and asked to speak with the hospitalist on call if the hospitalist that took care of you is not available. Once you are discharged, your primary care physician will handle any further medical issues. Please note that NO REFILLS for any discharge medications will be authorized once you are discharged, as it is imperative that you return to your primary care physician (or establish a relationship with a primary care physician if you do not have one) for your aftercare needs so that they can reassess your need for medications and monitor your lab values.     Today  Chief Complaint  Patient presents with  . Chest Pain  . Shortness of Breath   Patient tolerated breakfast today.  Wants to go home.  Denies any nausea vomiting or abdominal pain.  Son at bedside.  Agreeable  ROS:  CONSTITUTIONAL: Denies fevers, chills. Denies any fatigue, weakness.  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 EARS, NOSE, THROAT: Denies tinnitus, ear pain, hearing loss. RESPIRATORY: Denies cough, wheeze, shortness of breath.  CARDIOVASCULAR: Denies chest pain, palpitations, edema.   GASTROINTESTINAL: Denies nausea, vomiting, diarrhea, abdominal pain. Denies bright red blood per rectum. GENITOURINARY: Denies dysuria, hematuria. ENDOCRINE: Denies nocturia or thyroid problems. HEMATOLOGIC AND LYMPHATIC: Denies easy bruising or bleeding. SKIN: Denies rash or lesion. MUSCULOSKELETAL: Denies pain in neck, back, shoulder, knees, hips or arthritic symptoms.  NEUROLOGIC: Denies paralysis, paresthesias.  PSYCHIATRIC: Denies anxiety or depressive symptoms.   VITAL SIGNS:  Blood pressure (!) 146/76, pulse 79, temperature 97.6 F (36.4 C), temperature source Axillary, resp. rate 20, height 4\' 11"  (1.499 m), weight 58.9 kg, SpO2 99 %.  I/O:    Intake/Output Summary (Last 24 hours) at 10/04/2017 1055 Last data filed at 10/04/2017 0900 Gross per 24 hour  Intake 240 ml  Output 1000 ml  Net -760 ml    PHYSICAL EXAMINATION:  GENERAL:  84 y.o.-year-old patient lying in the bed with no acute distress.  EYES: Pupils equal, round, reactive to light and accommodation. No scleral icterus. Extraocular muscles intact.  HEENT: Head atraumatic, normocephalic. Oropharynx and nasopharynx clear.  NECK:  Supple, no jugular venous distention. No  thyroid enlargement, no tenderness.  LUNGS: Normal breath sounds bilaterally, no wheezing, rales,rhonchi or crepitation. No use of accessory muscles of respiration.  CARDIOVASCULAR: S1, S2 normal. No murmurs, rubs, or gallops.  ABDOMEN: Soft, non-tender, non-distended. Bowel sounds present.  EXTREMITIES: No pedal edema, cyanosis, or clubbing.  NEUROLOGIC: Cranial nerves II through XII are intact. Sensation intact. Gait not checked.  PSYCHIATRIC: The patient is alert and oriented x 3.  SKIN: No obvious rash, lesion, or ulcer.   DATA REVIEW:   CBC Recent Labs  Lab 10/04/17 0644  WBC 7.2  HGB 11.6*  HCT 33.2*  PLT 275    Chemistries  Recent Labs  Lab 10/04/17 0644  NA 142  K 3.6  CL 110  CO2 23  GLUCOSE 75  BUN 21  CREATININE  0.82  CALCIUM 8.6*  AST 42*  ALT 79*  ALKPHOS 269*  BILITOT 1.0    Cardiac Enzymes Recent Labs  Lab 10/01/17 1710  TROPONINI <0.03    Microbiology Results  No results found for this or any previous visit.  RADIOLOGY:  Dg Chest 2 View  Result Date: 10/01/2017 CLINICAL DATA:  Chest pain, shortness of breath EXAM: CHEST - 2 VIEW COMPARISON:  11/25/2016 FINDINGS: There is bilateral chronic interstitial thickening. There is no focal parenchymal opacity. There is no pleural effusion or pneumothorax. There is stable cardiomegaly. There is no acute osseous abnormality. There is an old healed left proximal humeral fracture. There is degenerative disc disease of the thoracic spine. IMPRESSION: No active cardiopulmonary disease. Electronically Signed   By: Kathreen Devoid   On: 10/01/2017 13:53   Mr 3d Recon At Scanner  Result Date: 10/02/2017 CLINICAL DATA:  Inpatient. Epigastric abdominal pain, nausea and vomiting. Cholelithiasis with dilated CBD on ultrasound. Abnormal liver function tests. EXAM: MRI ABDOMEN WITHOUT AND WITH CONTRAST (INCLUDING MRCP) TECHNIQUE: Multiplanar multisequence MR imaging of the abdomen was performed both before and after the administration of intravenous contrast. Heavily T2-weighted images of the biliary and pancreatic ducts were obtained, and three-dimensional MRCP images were rendered by post processing. CONTRAST:  19mL MULTIHANCE GADOBENATE DIMEGLUMINE 529 MG/ML IV SOLN COMPARISON:  10/01/2017 abdominal sonogram. FINDINGS: Lower chest: Medial left lower lobe 8 mm pulmonary nodule (series 17/image 11), stable since 2012 CT abdomen study, considered benign. Hepatobiliary: Normal liver size and configuration. No hepatic steatosis. No liver mass. Layering sub 5 mm gallstones in the gallbladder. No significant gallbladder distention. Marked diffuse gallbladder wall thickening with a small amount of pericholecystic fluid. No significant intrahepatic biliary ductal dilatation.  Mildly dilated common bile duct, unchanged since 12/14/2010 CT abdomen study. Common bile duct diameter 8 mm. No evidence of choledocholithiasis. Moderate periampullary duodenal diverticulum. Pancreas: No pancreatic mass or significant duct dilation. No pancreas divisum. Spleen: Normal size. No mass. Adrenals/Urinary Tract: Normal adrenals. No hydronephrosis. Small simple parapelvic renal cysts in both kidneys. No suspicious renal masses. Stomach/Bowel: Small hiatal hernia. Otherwise normal nondistended stomach. Visualized small and large bowel is normal caliber, with no bowel wall thickening. Marked colonic diverticulosis. Vascular/Lymphatic: Atherosclerotic nonaneurysmal abdominal aorta. Patent portal, splenic, hepatic and renal veins. No pathologically enlarged lymph nodes in the abdomen. Other: Trace perihepatic ascites anteriorly. No focal fluid collection. Musculoskeletal: No aggressive appearing focal osseous lesions. IMPRESSION: 1. Cholelithiasis. Marked diffuse gallbladder wall thickening. Small amount of pericholecystic fluid. Acute cholecystitis cannot be excluded. 2. No significant intrahepatic biliary ductal dilatation. Chronic dilatation of the common bile duct (8 mm diameter), stable since 2012 CT abdomen study. No choledocholithiasis. Moderate periampullary duodenal diverticulum.  3. Small hiatal hernia. 4. Marked colonic diverticulosis. 5.  Aortic Atherosclerosis (ICD10-I70.0). Electronically Signed   By: Ilona Sorrel M.D.   On: 10/02/2017 01:58   Mr Abdomen Mrcp Moise Boring Contast  Result Date: 10/02/2017 CLINICAL DATA:  Inpatient. Epigastric abdominal pain, nausea and vomiting. Cholelithiasis with dilated CBD on ultrasound. Abnormal liver function tests. EXAM: MRI ABDOMEN WITHOUT AND WITH CONTRAST (INCLUDING MRCP) TECHNIQUE: Multiplanar multisequence MR imaging of the abdomen was performed both before and after the administration of intravenous contrast. Heavily T2-weighted images of the biliary and  pancreatic ducts were obtained, and three-dimensional MRCP images were rendered by post processing. CONTRAST:  61mL MULTIHANCE GADOBENATE DIMEGLUMINE 529 MG/ML IV SOLN COMPARISON:  10/01/2017 abdominal sonogram. FINDINGS: Lower chest: Medial left lower lobe 8 mm pulmonary nodule (series 17/image 11), stable since 2012 CT abdomen study, considered benign. Hepatobiliary: Normal liver size and configuration. No hepatic steatosis. No liver mass. Layering sub 5 mm gallstones in the gallbladder. No significant gallbladder distention. Marked diffuse gallbladder wall thickening with a small amount of pericholecystic fluid. No significant intrahepatic biliary ductal dilatation. Mildly dilated common bile duct, unchanged since 12/14/2010 CT abdomen study. Common bile duct diameter 8 mm. No evidence of choledocholithiasis. Moderate periampullary duodenal diverticulum. Pancreas: No pancreatic mass or significant duct dilation. No pancreas divisum. Spleen: Normal size. No mass. Adrenals/Urinary Tract: Normal adrenals. No hydronephrosis. Small simple parapelvic renal cysts in both kidneys. No suspicious renal masses. Stomach/Bowel: Small hiatal hernia. Otherwise normal nondistended stomach. Visualized small and large bowel is normal caliber, with no bowel wall thickening. Marked colonic diverticulosis. Vascular/Lymphatic: Atherosclerotic nonaneurysmal abdominal aorta. Patent portal, splenic, hepatic and renal veins. No pathologically enlarged lymph nodes in the abdomen. Other: Trace perihepatic ascites anteriorly. No focal fluid collection. Musculoskeletal: No aggressive appearing focal osseous lesions. IMPRESSION: 1. Cholelithiasis. Marked diffuse gallbladder wall thickening. Small amount of pericholecystic fluid. Acute cholecystitis cannot be excluded. 2. No significant intrahepatic biliary ductal dilatation. Chronic dilatation of the common bile duct (8 mm diameter), stable since 2012 CT abdomen study. No choledocholithiasis.  Moderate periampullary duodenal diverticulum. 3. Small hiatal hernia. 4. Marked colonic diverticulosis. 5.  Aortic Atherosclerosis (ICD10-I70.0). Electronically Signed   By: Ilona Sorrel M.D.   On: 10/02/2017 01:58   US Abdomen Limited Ruq  Result Date: 10/01/2017 CLINICAL DATA:  Abnormal LFTs. EXAM: ULTRASOUND ABDOMEN LIMITED RIGHT UPPER QUADRANT COMPARISON:  None. FINDINGS: Gallbladder: Moderate cholelithiasis with sludge and moderate gallbladder wall thickening measuring up to 1.2 cm. Negative sonographic Murphy sign. Largest stone measures 7 mm. No adjacent free fluid. Common bile duct: Diameter: 9 mm. Liver: Suggestion of echogenic portal triads over the left lobe without focal mass. Portal vein is patent on color Doppler imaging with normal direction of blood flow towards the liver. IMPRESSION: Moderate cholelithiasis with sludge and moderate gallbladder wall thickening. Upper normal diameter of the common bile duct measuring 9 mm. Findings may be seen with acute cholecystitis. Mild increased echogenicity of the portal triads over the left lobe of the liver which can be seen with hepatitis. Electronically Signed   By: Marin Olp M.D.   On: 10/01/2017 19:39    EKG:   Orders placed or performed during the hospital encounter of 10/01/17  . EKG 12-Lead  . EKG 12-Lead  . ED EKG within 10 minutes  . ED EKG within 10 minutes      Management plans discussed with the patient, family and they are in agreement.  CODE STATUS:     Code Status Orders  (From  admission, onward)         Start     Ordered   10/02/17 0105  Do not attempt resuscitation (DNR)  Continuous    Question Answer Comment  In the event of cardiac or respiratory ARREST Do not call a "code blue"   In the event of cardiac or respiratory ARREST Do not perform Intubation, CPR, defibrillation or ACLS   In the event of cardiac or respiratory ARREST Use medication by any route, position, wound care, and other measures to relive  pain and suffering. May use oxygen, suction and manual treatment of airway obstruction as needed for comfort.      10/02/17 0104        Code Status History    Date Active Date Inactive Code Status Order ID Comments User Context   11/25/2016 1243 11/26/2016 1612 DNR [id number]  Idelle Crouch, MD Inpatient   11/29/2015 2210 12/01/2015 1706 Full Code [id number]  Gladstone Lighter, MD Inpatient   08/30/2014 1814 08/31/2014 2011 DNR [id number]  Idelle Crouch, MD Inpatient    Advance Directive Documentation     Most Recent Value  Type of Advance Directive  Healthcare Power of Attorney, Living will  Pre-existing out of facility DNR order (yellow form or pink MOST form)  -  "MOST" Form in Place?  -      TOTAL TIME TAKING CARE OF THIS PATIENT: 32minutes.   Note: This dictation was prepared with Dragon dictation along with smaller phrase technology. Any transcriptional errors that result from this process are unintentional.   @[facility] @  on 10/04/2017 at 10:55 AM  Between 7am to 6pm - Pager - [phone]  After 6pm go to [email or web address] - password [email or web address] [facility]  Office  [phone]  CC: Primary care physician; Baxter Hire, MD

## 2017-10-07 DIAGNOSIS — F0390 Unspecified dementia without behavioral disturbance: Secondary | ICD-10-CM | POA: Diagnosis not present

## 2017-10-07 DIAGNOSIS — K8011 Calculus of gallbladder with chronic cholecystitis with obstruction: Secondary | ICD-10-CM | POA: Diagnosis not present

## 2017-10-07 DIAGNOSIS — M6281 Muscle weakness (generalized): Secondary | ICD-10-CM | POA: Diagnosis not present

## 2017-10-07 DIAGNOSIS — K851 Biliary acute pancreatitis without necrosis or infection: Secondary | ICD-10-CM | POA: Diagnosis not present

## 2017-10-07 DIAGNOSIS — Z9010 Acquired absence of unspecified breast and nipple: Secondary | ICD-10-CM | POA: Diagnosis not present

## 2017-10-07 DIAGNOSIS — R945 Abnormal results of liver function studies: Secondary | ICD-10-CM | POA: Diagnosis not present

## 2017-10-07 DIAGNOSIS — H3530 Unspecified macular degeneration: Secondary | ICD-10-CM | POA: Diagnosis not present

## 2017-10-07 DIAGNOSIS — H9190 Unspecified hearing loss, unspecified ear: Secondary | ICD-10-CM | POA: Diagnosis not present

## 2017-10-07 DIAGNOSIS — J449 Chronic obstructive pulmonary disease, unspecified: Secondary | ICD-10-CM | POA: Diagnosis not present

## 2017-10-07 DIAGNOSIS — Z853 Personal history of malignant neoplasm of breast: Secondary | ICD-10-CM | POA: Diagnosis not present

## 2017-10-08 DIAGNOSIS — K851 Biliary acute pancreatitis without necrosis or infection: Secondary | ICD-10-CM

## 2017-10-09 DIAGNOSIS — K810 Acute cholecystitis: Secondary | ICD-10-CM | POA: Diagnosis not present

## 2017-10-09 DIAGNOSIS — Z09 Encounter for follow-up examination after completed treatment for conditions other than malignant neoplasm: Secondary | ICD-10-CM | POA: Diagnosis not present

## 2017-10-17 ENCOUNTER — Inpatient Hospital Stay
Admission: EM | Admit: 2017-10-17 | Discharge: 2017-10-19 | DRG: 439 | Disposition: A | Discharge status: Other Acute Inpatient Hospital | Payer: PPO | Attending: Internal Medicine | Admitting: Internal Medicine

## 2017-10-17 ENCOUNTER — Other Ambulatory Visit: Payer: Self-pay

## 2017-10-17 ENCOUNTER — Emergency Department: Payer: PPO

## 2017-10-17 ENCOUNTER — Inpatient Hospital Stay: Payer: PPO

## 2017-10-17 DIAGNOSIS — K5790 Diverticulosis of intestine, part unspecified, without perforation or abscess without bleeding: Secondary | ICD-10-CM | POA: Diagnosis not present

## 2017-10-17 DIAGNOSIS — F0280 Dementia in other diseases classified elsewhere without behavioral disturbance: Secondary | ICD-10-CM | POA: Diagnosis not present

## 2017-10-17 DIAGNOSIS — K851 Biliary acute pancreatitis without necrosis or infection: Principal | ICD-10-CM | POA: Diagnosis present

## 2017-10-17 DIAGNOSIS — Z8249 Family history of ischemic heart disease and other diseases of the circulatory system: Secondary | ICD-10-CM | POA: Diagnosis not present

## 2017-10-17 DIAGNOSIS — I10 Essential (primary) hypertension: Secondary | ICD-10-CM | POA: Diagnosis not present

## 2017-10-17 DIAGNOSIS — H3530 Unspecified macular degeneration: Secondary | ICD-10-CM | POA: Diagnosis present

## 2017-10-17 DIAGNOSIS — Z79899 Other long term (current) drug therapy: Secondary | ICD-10-CM | POA: Diagnosis not present

## 2017-10-17 DIAGNOSIS — R1010 Upper abdominal pain, unspecified: Secondary | ICD-10-CM | POA: Diagnosis not present

## 2017-10-17 DIAGNOSIS — I6529 Occlusion and stenosis of unspecified carotid artery: Secondary | ICD-10-CM | POA: Diagnosis not present

## 2017-10-17 DIAGNOSIS — J45909 Unspecified asthma, uncomplicated: Secondary | ICD-10-CM | POA: Diagnosis not present

## 2017-10-17 DIAGNOSIS — K660 Peritoneal adhesions (postprocedural) (postinfection): Secondary | ICD-10-CM | POA: Diagnosis not present

## 2017-10-17 DIAGNOSIS — K5730 Diverticulosis of large intestine without perforation or abscess without bleeding: Secondary | ICD-10-CM | POA: Diagnosis not present

## 2017-10-17 DIAGNOSIS — G309 Alzheimer's disease, unspecified: Secondary | ICD-10-CM | POA: Diagnosis not present

## 2017-10-17 DIAGNOSIS — R1011 Right upper quadrant pain: Secondary | ICD-10-CM | POA: Diagnosis not present

## 2017-10-17 DIAGNOSIS — R7989 Other specified abnormal findings of blood chemistry: Secondary | ICD-10-CM | POA: Diagnosis present

## 2017-10-17 DIAGNOSIS — K859 Acute pancreatitis without necrosis or infection, unspecified: Secondary | ICD-10-CM | POA: Diagnosis present

## 2017-10-17 DIAGNOSIS — Z7982 Long term (current) use of aspirin: Secondary | ICD-10-CM | POA: Diagnosis not present

## 2017-10-17 DIAGNOSIS — F0390 Unspecified dementia without behavioral disturbance: Secondary | ICD-10-CM | POA: Diagnosis present

## 2017-10-17 DIAGNOSIS — K810 Acute cholecystitis: Secondary | ICD-10-CM | POA: Diagnosis not present

## 2017-10-17 DIAGNOSIS — R1110 Vomiting, unspecified: Secondary | ICD-10-CM

## 2017-10-17 DIAGNOSIS — I4510 Unspecified right bundle-branch block: Secondary | ICD-10-CM | POA: Diagnosis not present

## 2017-10-17 DIAGNOSIS — Z9010 Acquired absence of unspecified breast and nipple: Secondary | ICD-10-CM

## 2017-10-17 DIAGNOSIS — Z66 Do not resuscitate: Secondary | ICD-10-CM | POA: Diagnosis not present

## 2017-10-17 DIAGNOSIS — R945 Abnormal results of liver function studies: Secondary | ICD-10-CM | POA: Diagnosis not present

## 2017-10-17 DIAGNOSIS — Z853 Personal history of malignant neoplasm of breast: Secondary | ICD-10-CM | POA: Diagnosis not present

## 2017-10-17 DIAGNOSIS — K812 Acute cholecystitis with chronic cholecystitis: Secondary | ICD-10-CM | POA: Diagnosis present

## 2017-10-17 DIAGNOSIS — Z880 Allergy status to penicillin: Secondary | ICD-10-CM | POA: Diagnosis not present

## 2017-10-17 DIAGNOSIS — I2510 Atherosclerotic heart disease of native coronary artery without angina pectoris: Secondary | ICD-10-CM | POA: Diagnosis not present

## 2017-10-17 DIAGNOSIS — K8020 Calculus of gallbladder without cholecystitis without obstruction: Secondary | ICD-10-CM | POA: Diagnosis not present

## 2017-10-17 DIAGNOSIS — F05 Delirium due to known physiological condition: Secondary | ICD-10-CM | POA: Diagnosis not present

## 2017-10-17 DIAGNOSIS — R109 Unspecified abdominal pain: Secondary | ICD-10-CM

## 2017-10-17 DIAGNOSIS — K8012 Calculus of gallbladder with acute and chronic cholecystitis without obstruction: Secondary | ICD-10-CM | POA: Diagnosis not present

## 2017-10-17 LAB — COMPREHENSIVE METABOLIC PANEL
ALK PHOS: 228 U/L — AB (ref 38–126)
ALT: 45 U/L — AB (ref 0–44)
ANION GAP: 11 (ref 5–15)
AST: 129 U/L — AB (ref 15–41)
Albumin: 3.9 g/dL (ref 3.5–5.0)
BILIRUBIN TOTAL: 1.5 mg/dL — AB (ref 0.3–1.2)
BUN: 17 mg/dL (ref 8–23)
CO2: 24 mmol/L (ref 22–32)
Calcium: 9 mg/dL (ref 8.9–10.3)
Chloride: 105 mmol/L (ref 98–111)
Creatinine, Ser: 1.01 mg/dL — ABNORMAL HIGH (ref 0.44–1.00)
GFR, EST AFRICAN AMERICAN: 49 mL/min — AB (ref >60)
GFR, EST NON AFRICAN AMERICAN: 42 mL/min — AB (ref >60)
Glucose, Bld: 164 mg/dL — ABNORMAL HIGH (ref 70–99)
POTASSIUM: 4.4 mmol/L (ref 3.5–5.1)
Sodium: 140 mmol/L (ref 135–145)
TOTAL PROTEIN: 6.8 g/dL (ref 6.5–8.1)

## 2017-10-17 LAB — CBC WITH DIFFERENTIAL/PLATELET
Abs Immature Granulocytes: 0.06 10*3/uL (ref 0.00–0.07)
BASOS ABS: 0 10*3/uL (ref 0.0–0.1)
Basophils Relative: 0 %
EOS ABS: 0 10*3/uL (ref 0.0–0.5)
EOS PCT: 0 %
HEMATOCRIT: 41.6 % (ref 36.0–46.0)
Hemoglobin: 13.3 g/dL (ref 12.0–15.0)
Immature Granulocytes: 1 %
LYMPHS PCT: 8 %
Lymphs Abs: 0.8 10*3/uL (ref 0.7–4.0)
MCH: 29.6 pg (ref 26.0–34.0)
MCHC: 32 g/dL (ref 30.0–36.0)
MCV: 92.7 fL (ref 80.0–100.0)
Monocytes Absolute: 0.7 10*3/uL (ref 0.1–1.0)
Monocytes Relative: 7 %
NEUTROS PCT: 84 %
NRBC: 0 % (ref 0.0–0.2)
Neutro Abs: 8.3 10*3/uL — ABNORMAL HIGH (ref 1.7–7.7)
PLATELETS: 197 10*3/uL (ref 150–400)
RBC: 4.49 MIL/uL (ref 3.87–5.11)
RDW: 13.5 % (ref 11.5–15.5)
WBC: 10 10*3/uL (ref 4.0–10.5)

## 2017-10-17 LAB — LIPASE, BLOOD: LIPASE: 3146 U/L — AB (ref 11–51)

## 2017-10-17 LAB — TROPONIN I: Troponin I: <0.03 ng/mL (ref <0.03)

## 2017-10-17 MED ORDER — ACETAMINOPHEN 650 MG RE SUPP: 650.0000 mg | Freq: Four times a day (QID) | RECTAL | Status: DC | PRN

## 2017-10-17 MED ORDER — SODIUM CHLORIDE 0.9 % IV BOLUS
250.0000 mL | Freq: Once | INTRAVENOUS | Status: AC
  Administered 2017-10-17: 250 mL via INTRAVENOUS

## 2017-10-17 MED ORDER — ONDANSETRON HCL 4 MG/2ML IJ SOLN
4.0000 mg | Freq: Once | INTRAMUSCULAR | Status: AC
  Administered 2017-10-17: 4 mg via INTRAVENOUS
  Filled 2017-10-17: qty 2

## 2017-10-17 MED ORDER — SODIUM CHLORIDE 0.9 % IV SOLN
INTRAVENOUS | Status: DC
  Administered 2017-10-17 – 2017-10-18 (×2): via INTRAVENOUS

## 2017-10-17 MED ORDER — SODIUM CHLORIDE 0.9 % IV SOLN
1.0000 g | INTRAVENOUS | Status: DC
  Administered 2017-10-18: 1 g via INTRAVENOUS
  Filled 2017-10-17 (×2): qty 10

## 2017-10-17 MED ORDER — ACETAMINOPHEN 325 MG PO TABS: 650.0000 mg | ORAL_TABLET | Freq: Four times a day (QID) | ORAL | Status: DC | PRN

## 2017-10-17 MED ORDER — HALOPERIDOL LACTATE 5 MG/ML IJ SOLN
1.0000 mg | Freq: Four times a day (QID) | INTRAMUSCULAR | Status: DC | PRN
  Administered 2017-10-17: 1 mg via INTRAVENOUS
  Filled 2017-10-17: qty 1

## 2017-10-17 MED ORDER — ONDANSETRON HCL 4 MG PO TABS: 4.0000 mg | ORAL_TABLET | Freq: Four times a day (QID) | ORAL | Status: DC | PRN

## 2017-10-17 MED ORDER — SODIUM CHLORIDE 0.9 % IV SOLN
1.0000 g | Freq: Once | INTRAVENOUS | Status: AC
  Administered 2017-10-17: 17:00:00 via INTRAVENOUS
  Filled 2017-10-17: qty 10

## 2017-10-17 MED ORDER — OXYCODONE HCL 5 MG PO TABS: 2.5000 mg | ORAL_TABLET | Freq: Four times a day (QID) | ORAL | Status: DC | PRN

## 2017-10-17 MED ORDER — ONDANSETRON HCL 4 MG/2ML IJ SOLN: 4.0000 mg | Freq: Four times a day (QID) | INTRAMUSCULAR | Status: DC | PRN

## 2017-10-17 NOTE — ED Triage Notes (Signed)
Pt called EMS.  C/O abdominal pain.  Hx of gallstones.  Pt refused BP.  Sats 100% on room air.

## 2017-10-17 NOTE — ED Notes (Signed)
Surgeon is in room talking to pts son.

## 2017-10-17 NOTE — ED Provider Notes (Addendum)
[facility] Emergency Department Provider Note  ____________________________________________   I have reviewed the triage vital signs and the nursing notes. Where available I have reviewed prior notes and, if possible and indicated, outside hospital notes.    HISTORY  Chief Complaint Abdominal Pain    HPI Brandi Nelson is a 84 y.o. female  Who was recently discharged from the hospital with a history of cholecystitis which was nonoperatively managed and she got better.  Apparently, she is been doing fine at home until today which she had some episodes of vomiting which is nonbloody nonbilious, and some right upper quadrant discomfort.  No if fevers no chills.  Patient herself is very hard of hearing, and very demented and cannot give a history.  She is at her baseline.  History as per family.  Level 5 chart caveat; no further history available due to patient status. Per notes patient is DNR, and a very poor surgical risk   Past Medical History:  Diagnosis Date  . Asthma   . Cancer ([place])    breast  . Dementia   . Hearing loss   . Macular degeneration     Patient Active Problem List   Diagnosis Date Noted  . Gallstone pancreatitis   . Palliative care by specialist   . DNR (do not resuscitate)   . Weakness generalized   . Cholecystitis with cholelithiasis 10/01/2017  . Dementia ([place]) 10/01/2017  . Dehydration 11/25/2016  . Rhabdomyolysis 11/25/2016  . Respiratory failure ([place]) 11/29/2015  . COPD exacerbation ([place]) 11/29/2015  . Chest pain 08/30/2014  . Elevated troponin 08/30/2014    Past Surgical History:  Procedure Laterality Date  . MASTECTOMY      Prior to Admission medications   Not on File    Allergies Penicillin g  Family History  Problem Relation Age of Onset  . Heart attack Mother   . Heart attack Father     Social History Social History   Tobacco Use  . Smoking status: Never Smoker  . Smokeless tobacco: Never Used   Substance Use Topics  . Alcohol use: No  . Drug use: Not on file    Review of Systems per family Constitutional: No fever/chills Eyes: No visual changes. ENT: No sore throat. No stiff neck no neck pain Cardiovascular: Denies chest pain. Respiratory: Denies shortness of breath. Gastrointestinal:   + vomiting.  No diarrhea.  No constipation. Genitourinary: Negative for dysuria. Musculoskeletal: Negative lower extremity swelling Skin: Negative for rash. Neurological: Negative for severe headaches, focal weakness or numbness.   ____________________________________________   PHYSICAL EXAM:  VITAL SIGNS: ED Triage Vitals  Enc Vitals Group     BP --      Pulse Rate 10/17/17 1428 85     Resp --      Temp 10/17/17 1428 (!) 97.5 F (36.4 C)     Temp src --      SpO2 10/17/17 1428 96 %     Weight 10/17/17 1503 129 lb 14.4 oz (58.9 kg)     Height 10/17/17 1503 4\' 11"  (1.499 m)     Head Circumference --      Peak Flow --      Pain Score 10/17/17 1431 0     Pain Loc --      Pain Edu? --      Excl. in [place]? --     Constitutional: Alert and oriented to name unsure where she is wants to go home Eyes: Conjunctivae are  normal Head: Atraumatic HEENT: No congestion/rhinnorhea. Mucous membranes are moist.  Oropharynx non-erythematous Neck:   Nontender with no meningismus, no masses, no stridor Cardiovascular: Normal rate, regular rhythm. Grossly normal heart sounds.  Good peripheral circulation. Respiratory: Normal respiratory effort.  No retractions. Lungs CTAB. Abdominal: Soft and positive right upper quadrant abdominal tenderness with no peritoneal signs. No distention. No guarding no rebound Back:  There is no focal tenderness or step off.  there is no midline tenderness there are no lesions noted. there is no CVA tenderness Musculoskeletal: No lower extremity tenderness, no upper extremity tenderness. No joint effusions, no DVT signs strong distal pulses no edema Neurologic:   Normal speech and language. No gross focal neurologic deficits are appreciated.  Skin:  Skin is warm, dry and intact. No rash noted. Psychiatric: Mood and affect are mostly demented. Speech and behavior are normal.  ____________________________________________   LABS (all labs ordered are listed, but only abnormal results are displayed)  Labs Reviewed  CBC WITH DIFFERENTIAL/PLATELET - Abnormal; Notable for the following components:      Result Value   Neutro Abs 8.3 (*)    All other components within normal limits  COMPREHENSIVE METABOLIC PANEL  LIPASE, BLOOD  TROPONIN I  URINALYSIS, COMPLETE (UACMP) WITH MICROSCOPIC    Pertinent labs  results that were available during my care of the patient were reviewed by me and considered in my medical decision making (see chart for details). ____________________________________________  EKG  I personally interpreted any EKGs ordered by me or triage  ____________________________________________  RADIOLOGY  Pertinent labs & imaging results that were available during my care of the patient were reviewed by me and considered in my medical decision making (see chart for details). If possible, patient and/or family made aware of any abnormal findings.  No results found. ____________________________________________    PROCEDURES  Procedure(s) performed: None  Procedures  Critical Care performed: None  ____________________________________________   INITIAL IMPRESSION / ASSESSMENT AND PLAN / ED COURSE  Pertinent labs & imaging results that were available during my care of the patient were reviewed by me and considered in my medical decision making (see chart for details).  Patient here with right upper quadrant pain less likely recurrence of her cholecystitis, white count is reassuring, vital signs are reassuring blood pressure was not obtained as patient refused.  I had extensive talk with the family there does seem to be some  question amongst them about what they want to do.  It is in my opinion difficult to take care of 84 year old lady who absolutely does not want to be here however they are decision-makers to the family.  Accordingly, we will treat her with nausea medication, I will give her IV fluids.  She is not complaining of pain and seems comfortable at this time I will try to avoid narcotics given her age, will obtain ultrasound as there was some possible talk of percutaneous drain placement last time she was here although not sure if they would want to do that, we will see what her liver function tests and lipase arm we will reassess.  ----------------------------------------- 5:32 PM on 10/17/2017 -----------------------------------------  Patient is all signs are reassuring to the extent that she will thus get them, lipase however is elevated again, I have consulted surgery and medicine they will admit to the patient to their service I am giving her IV antibiotics and we will continue to monitor    ____________________________________________   FINAL CLINICAL IMPRESSION(S) / ED DIAGNOSES  Final  diagnoses:  Vomiting      This chart was dictated using voice recognition software.  Despite best efforts to proofread,  errors can occur which can change meaning.  ]    Schuyler Amor, MD 10/17/17 1611    Schuyler Amor, MD 10/17/17 [phone]

## 2017-10-17 NOTE — ED Notes (Signed)
Pt cries and pulls BP cuff off when attempting to get a BP.

## 2017-10-17 NOTE — H&P (Signed)
[place] [facility] [place] NAME: Brandi Nelson    MR#:  [id number]  DATE OF BIRTH:  01-13-1907  DATE OF ADMISSION:  10/17/2017  PRIMARY CARE PHYSICIAN: Baxter Hire, MD   REQUESTING/REFERRING PHYSICIAN: Dr Debbe Odea  CHIEF COMPLAINT:   Chief Complaint  Patient presents with  . Abdominal Pain    HISTORY OF PRESENT ILLNESS:  Brandi Nelson  is a 84 y.o. female with a known history of dementia.  The patient is not the best historian.  Cannot remember that she was sick at home.  She was recently in the hospital for pancreatitis from gallbladder source.  Because of her age she was treated conservatively.  As per the son, she was vomiting when he came to pick her up around noon.  She had some pain in her abdomen and some abdominal distention she got hot and was short of breath.  The patient cannot recall any of the symptoms.  PAST MEDICAL HISTORY:   Past Medical History:  Diagnosis Date  . Asthma   . Cancer ([place])    breast  . Dementia   . Hearing loss   . Macular degeneration     PAST SURGICAL HISTORY:   Past Surgical History:  Procedure Laterality Date  . MASTECTOMY      SOCIAL HISTORY:   Social History   Tobacco Use  . Smoking status: Never Smoker  . Smokeless tobacco: Never Used  Substance Use Topics  . Alcohol use: No    FAMILY HISTORY:   Family History  Problem Relation Age of Onset  . Heart attack Mother   . Heart attack Father     DRUG ALLERGIES:   Allergies  Allergen Reactions  . Penicillin G     Has patient had a PCN reaction causing immediate rash, facial/tongue/throat swelling, SOB or lightheadedness with hypotension: no  Has patient had a PCN reaction causing severe rash involving mucus membranes or skin necrosis: no Has patient had a PCN reaction that required hospitalization no Has patient had a PCN reaction occurring within the last 10 years: no If all of the above answers are "NO",  then may proceed with Cephalosporin use.     REVIEW OF SYSTEMS:  Not reliable with dementia   RESPIRATORY: No shortness of breath.  CARDIOVASCULAR: No chest pain.  GASTROINTESTINAL: No nausea, vomiting, or abdominal pain.   MEDICATIONS AT HOME:   Prior to Admission medications   Not on File   Patient does not take any medications  VITAL SIGNS:  Pulse (!) 104, temperature (!) 97.5 F (36.4 C), resp. rate 19, height 4\' 11"  (1.499 m), weight 58.9 kg, SpO2 92 %.  PHYSICAL EXAMINATION:  GENERAL:  84 y.o.-year-old patient lying in the bed with no acute distress.  EYES: Pupils equal, round, reactive to light and accommodation. No scleral icterus.  HEENT: Head atraumatic, normocephalic. Oropharynx and nasopharynx clear.  NECK:  Supple, no jugular venous distention. No thyroid enlargement, no tenderness.  LUNGS: Normal breath sounds bilaterally, no wheezing, rales,rhonchi or crepitation. No use of accessory muscles of respiration.  CARDIOVASCULAR: S1, S2 normal. No murmurs, rubs, or gallops.  ABDOMEN: Soft, nontender, nondistended. Bowel sounds present. No organomegaly or mass.  EXTREMITIES: No pedal edema, cyanosis, or clubbing.  NEUROLOGIC: Cranial nerves II through XII are intact. PSYCHIATRIC: The patient is alert and answers a few questions.Marland Kitchen  SKIN: No rash, lesion, or ulcer.   LABORATORY PANEL:   CBC Recent Labs  Lab 10/17/17 1442  WBC 10.0  HGB 13.3  HCT 41.6  PLT 197   ------------------------------------------------------------------------------------------------------------------  Chemistries  Recent Labs  Lab 10/17/17 1442  NA 140  K 4.4  CL 105  CO2 24  GLUCOSE 164*  BUN 17  CREATININE 1.01*  CALCIUM 9.0  AST 129*  ALT 45*  ALKPHOS 228*  BILITOT 1.5*   ------------------------------------------------------------------------------------------------------------------  Cardiac Enzymes Recent Labs  Lab 10/17/17 1442  TROPONINI <0.03    ------------------------------------------------------------------------------------------------------------------  RADIOLOGY:  US Abdomen Limited Ruq  Result Date: 10/17/2017 CLINICAL DATA:  Vomiting EXAM: ULTRASOUND ABDOMEN LIMITED RIGHT UPPER QUADRANT COMPARISON:  10/01/2017 MRI abdomen and abdominal sonogram. FINDINGS: Gallbladder: Distended gallbladder. Moderate diffuse gallbladder wall thickening. Layering subcentimeter gallstones. Small amount of pericholecystic fluid. Small amount of sludge. No sonographic Murphy's sign. Common bile duct: Diameter: 8 mm Liver: No focal lesion identified. Within normal limits in parenchymal echogenicity. Portal vein is patent on color Doppler imaging with normal direction of blood flow towards the liver. IMPRESSION: 1. Cholelithiasis. Distended gallbladder. Moderate diffuse gallbladder wall thickening. Small amount of pericholecystic fluid. Findings are suspicious for acute cholecystitis, although no sonographic Percell Miller sign was reported. Hepatobiliary scintigraphy study may be considered for further evaluation, as clinically warranted. 2. Chronic dilatation of the common bile duct (8 mm diameter), unchanged from recent MRI and ultrasound studies. 3. Normal liver. Electronically Signed   By: Ilona Sorrel M.D.   On: 10/17/2017 17:05      IMPRESSION AND PLAN:   1.  Acute pancreatitis with likely gallbladder source.  Empiric antibiotics.  N.p.o. and gentle IV fluids.  Continue to monitor lipase.  General surgery to evaluate the patient.  Because of her dementia even if we did place a gallbladder tube she may pull it out.  2.  Elevated liver function tests.  Total bilirubin only 1.5.  Will trend at this point.  AST and ALT slightly elevated.  Alkaline phosphatase slightly elevated. 3.  Dementia.  PRN Haldol while here in the hospital.   All the records are reviewed and case discussed with ED provider. Management plans discussed with the patient, family and  they are in agreement.  CODE STATUS: DNR  TOTAL TIME TAKING CARE OF THIS PATIENT: 45 minutes, including acp time.    Loletha Grayer M.D on 10/17/2017 at 5:55 PM  Between 7am to 6pm - Pager - [phone]  After 6pm call admission pager [phone]  [facility] Office  [phone]  CC: Primary care physician; Baxter Hire, MD

## 2017-10-17 NOTE — Progress Notes (Signed)
Patient ID: Brandi Nelson, female   DOB: January 20, 1907, 84 y.o.   MRN: [id number]  ACP note  Patient unable to participate in conversation Son at the bedside  Diagnosis:Acute pancreatitis, acute cholecystitis, elevated liver function test, dementia   CODE STATUS discussed and patient is a DO NOT RESUSCITATE  Plan.  Conservative management with gentle IV fluids PRN pain medications and monitor liver function tests and lipase.  General surgery to see the patient.  Time spent on ACP discussion 17 minutes Dr. Loletha Grayer

## 2017-10-17 NOTE — Plan of Care (Signed)
  Problem: Activity: Goal: Risk for activity intolerance will decrease Outcome: Progressing   Problem: Coping: Goal: Level of anxiety will decrease Outcome: Progressing   Problem: Elimination: Goal: Will not experience complications related to bowel motility Outcome: Progressing Goal: Will not experience complications related to urinary retention Outcome: Progressing   Problem: Pain Managment: Goal: General experience of comfort will improve Outcome: Progressing   

## 2017-10-17 NOTE — Consult Note (Addendum)
Subjective:   CC: gallstone pancreatitis  HPI:  Brandi Nelson is a 84 y.o. female who is consulted by [facility] for evaluation of above cc.  Hx obtained through POA due to pt's baseline dementia.  Symptoms were first noted a few hours ago. Pain is abdominal, located in epigastric area.  Associated with distention and nausea/emesis, exacerbated by nothing specific.  Pt supposedly looked much more comfortable after an episode of emesis and decreased visibile abdominal distention afterwards.  Work-up in the ED included an elevated lipase level and a right upper quadrant ultrasound concerning for cholecystitis.  Surgery consulted for gallstone pancreatitis.  Of note patient was admitted for the same exact symptoms and issues just a couple weeks ago but resolved without any surgical intervention.  At that time GI and surgery both were consulted but due to her advanced age she was deemed a extremely high risk candidate for any sort of surgical intervention.  Antibiotics and bowel rest resolve the previous issue but she returns again for the same exact issue.  Currently the patient is DNR and the power of attorney states he has copies of the documentation at home.  Past Medical History:  has a past medical history of Asthma, Cancer ([place]), Dementia, Hearing loss, and Macular degeneration.  Past Surgical History:  has a past surgical history that includes Mastectomy.  Family History: family history includes Heart attack in her father and mother.  Social History:  reports that she has never smoked. She has never used smokeless tobacco. She reports that she does not drink alcohol. Her drug history is not on file.  Current Medications: per review of outside records, on aricept, aspirin, and vit b12  Allergies:  Allergies as of 10/17/2017 - Review Complete 10/01/2017  Allergen Reaction Noted  . Penicillin g      ROS:  A 15 point review of systems was performed and pertinent positives and negatives noted  in HPI    Objective:     BP (!) 168/88 (BP Location: Right Arm)   Pulse 71   Temp (!) 97.5 F (36.4 C)   Resp 20   Ht 4\' 11"  (1.499 m)   Wt 58.9 kg   SpO2 93%   BMI 26.24 kg/m    Constitutional :  alert, cooperative, distracted and confused at baseline  Lymphatics/Throat:  no asymmetry, masses, or scars  Respiratory:  clear to auscultation bilaterally  Cardiovascular:  regular rate and rhythm  Gastrointestinal: soft, but focal guarding and tenderness at epigastric level, minimal distention at time of exam.   Musculoskeletal: Steady gait and movement  Skin: Cool and moist  Psychiatric: Normal affect, non-agitated, not confused       LABS:  CMP Latest Ref Rng & Units 10/17/2017 10/04/2017 10/03/2017  Glucose 70 - 99 mg/dL 164(H) 75 79  BUN 8 - 23 mg/dL 17 21 23   Creatinine 0.44 - 1.00 mg/dL 1.01(H) 0.82 0.96  Sodium 135 - 145 mmol/L 140 142 144  Potassium 3.5 - 5.1 mmol/L 4.4 3.6 3.6  Chloride 98 - 111 mmol/L 105 110 113(H)  CO2 22 - 32 mmol/L 24 23 25   Calcium 8.9 - 10.3 mg/dL 9.0 8.6(L) 8.6(L)  Total Protein 6.5 - 8.1 g/dL 6.8 6.0(L) 5.9(L)  Total Bilirubin 0.3 - 1.2 mg/dL 1.5(H) 1.0 1.2  Alkaline Phos 38 - 126 U/L 228(H) 269(H) 329(H)  AST 15 - 41 U/L 129(H) 42(H) 96(H)  ALT 0 - 44 U/L 45(H) 79(H) 117(H)   CBC Latest Ref Rng & Units  10/17/2017 10/04/2017 10/03/2017  WBC 4.0 - 10.5 K/uL 10.0 7.2 8.9  Hemoglobin 12.0 - 15.0 g/dL 13.3 11.6(L) 11.7(L)  Hematocrit 36.0 - 46.0 % 41.6 33.2(L) 32.8(L)  Platelets 150 - 400 K/uL 197 275 255     RADS: PRIOR REPORT IMPORTED FROM THE [employer] WORKFLOW SYSTEM   REASON FOR EXAM:    upper abd pain  COMMENTS:   Body Site: GB and Fossa, CBD, Head of Pancreas   PROCEDURE:     [place]  - US ABDOMEN LIMITED SURVEY  - Oct 26 2012  8:18PM   RESULT:     Limited right upper quadrant abdominal ultrasound demonstrate  the common bile duct is a 7.8 mm in diameter which is within normal limits  for the patient's age. A small stone in the  gallbladder neck region is not  excluded but the study is technically limited because the patient's  inability to follow breathing instructions. The wall thickness of the  gallbladder is 2.4 mm. There is a negative sonographic Murphy's sign. The  liver appears grossly normal measuring 13.6 cm. There is limited  visualization of the pancreas.   IMPRESSION:      Cannot exclude cholelithiasis with a stone in the  gallbladder neck region. Please see the technical limitations mentioned  above.    Assessment:      Gallstone pancreatitis  Plan:     I had a very extensive discussion with the power of attorney Mr. Shawnika Pepin regarding the pathophysiology of gallstone pancreatitis and the available treatment options at this point.  I do agree with the previous assessment regarding how she is a very high risk candidate for any surgical intervention due to her advanced age.  With the slightly elevated bilirubin levels and evidence of pancreatitis again I would recommend a MRCP to confirm that there is no persistent choledocholithiasis from this episode.  Spoke with GI provider on-call Dr. Bonna Gains, with and she agrees with this initial work-up as well.  She did mention that if the MRCP is positive patient will likely need to be transferred since the GI provider that has ERCP capabilities at this hospital likely will not be available for the rest of the week.  If MRCP is negative Dr. Bonna Gains believes that a prophylactic common bile duct stent to prevent future episodes of pancreatitis will not be a viable option for this patient per the conversation we had over the phone.    I did discuss these potential options with the power of attorney and at this point they are still potentially open to lap chole if that ends up being the only option to prevent further episodes.  I did reiterate that they will have to be a discussion with the anesthesiologist as well but I personally am still very hesitant on  pursuing any sort of surgical intervention at this time.  With no obvious signs of acute cholecystitis except for the wall thickening noted on ultrasound, a cholecystostomy tube likely will not yield much benefit since it will not prevent another episode of gallstone pancreatitis.  Conservative management with IVF resuscitation and abx for possible cholecystitis.  Will f/u on MRCP and continue to monitor

## 2017-10-17 NOTE — ED Notes (Signed)
Patient transported to Ultrasound 

## 2017-10-18 ENCOUNTER — Encounter: Payer: Self-pay | Admitting: Internal Medicine

## 2017-10-18 DIAGNOSIS — K851 Biliary acute pancreatitis without necrosis or infection: Principal | ICD-10-CM

## 2017-10-18 LAB — COMPREHENSIVE METABOLIC PANEL
ALT: 250 U/L — ABNORMAL HIGH (ref 0–44)
AST: 502 U/L — AB (ref 15–41)
Albumin: 3.2 g/dL — ABNORMAL LOW (ref 3.5–5.0)
Alkaline Phosphatase: 287 U/L — ABNORMAL HIGH (ref 38–126)
Anion gap: 8 (ref 5–15)
BUN: 17 mg/dL (ref 8–23)
CHLORIDE: 107 mmol/L (ref 98–111)
CO2: 28 mmol/L (ref 22–32)
Calcium: 8.3 mg/dL — ABNORMAL LOW (ref 8.9–10.3)
Creatinine, Ser: 1.22 mg/dL — ABNORMAL HIGH (ref 0.44–1.00)
GFR calc Af Amer: 39 mL/min — ABNORMAL LOW (ref >60)
GFR, EST NON AFRICAN AMERICAN: 33 mL/min — AB (ref >60)
Glucose, Bld: 83 mg/dL (ref 70–99)
POTASSIUM: 4.2 mmol/L (ref 3.5–5.1)
SODIUM: 143 mmol/L (ref 135–145)
Total Bilirubin: 1.7 mg/dL — ABNORMAL HIGH (ref 0.3–1.2)
Total Protein: 5.9 g/dL — ABNORMAL LOW (ref 6.5–8.1)

## 2017-10-18 LAB — CBC
HCT: 34.3 % — ABNORMAL LOW (ref 36.0–46.0)
Hemoglobin: 10.9 g/dL — ABNORMAL LOW (ref 12.0–15.0)
MCH: 29.9 pg (ref 26.0–34.0)
MCHC: 31.8 g/dL (ref 30.0–36.0)
MCV: 94 fL (ref 80.0–100.0)
NRBC: 0 % (ref 0.0–0.2)
Platelets: 218 10*3/uL (ref 150–400)
RBC: 3.65 MIL/uL — ABNORMAL LOW (ref 3.87–5.11)
RDW: 13.8 % (ref 11.5–15.5)
WBC: 13.6 10*3/uL — AB (ref 4.0–10.5)

## 2017-10-18 LAB — TRIGLYCERIDES: TRIGLYCERIDES: 55 mg/dL (ref <150)

## 2017-10-18 LAB — LIPASE, BLOOD: Lipase: 812 U/L — ABNORMAL HIGH (ref 11–51)

## 2017-10-18 LAB — FERRITIN: FERRITIN: 122 ng/mL (ref 11–307)

## 2017-10-18 MED ORDER — METRONIDAZOLE IN NACL 5-0.79 MG/ML-% IV SOLN
500.0000 mg | Freq: Three times a day (TID) | INTRAVENOUS | Status: DC
  Administered 2017-10-18 – 2017-10-19 (×3): 500 mg via INTRAVENOUS
  Filled 2017-10-18 (×5): qty 100

## 2017-10-18 NOTE — Plan of Care (Signed)
Spoke with Dr. Lysle Pearl, planning for patient to be transferred to [facility] for surgical management. Discussed this with Dr. Darvin Neighbours, will hold off on seeing patient. Please call if there is a change in course.

## 2017-10-18 NOTE — Consult Note (Signed)
Brandi Antigua, MD [address] [place], [place], [place], [address] [address] [place], [place], [place], [address] Phone: [phone]  Fax: [phone]  Consultation  Referring Provider:     Dr. Darvin Neighbours Primary Care Physician:  Baxter Hire, MD Reason for Consultation:     Gallstone pancreatitis  Date of Admission:  10/17/2017 Date of Consultation:  10/18/2017         HPI:   Brandi Nelson is a 84 y.o. female who was admitted for the second time with abdominal pain, and gallstone pancreatitis.  She was previously admitted in September 2019 with similar symptoms.  She was considered not to be a surgical candidate by surgery team as far as her cholecystectomy goes to prevent further episodes of gallstone pancreatitis.  She has a history of dementia and history was obtained from family, and previous chart.  Patient was found to be vomiting at home, and reported pain in her abdomen with some abdominal distention, and was brought to the ER.  Symptoms ongoing for 1 to 2 days.  No hematemesis.  No diarrhea.  Today patient is sitting up in chair, and appears comfortable, and asks about eating.  Lab work showed lipase to be elevated in the 3000s.   Liver enzymes have been elevated since September 2019 since her first episode of pancreatitis, and her elevated on this admission as well.  MRCP shows peripancreatic edema favoring acute pancreatitis, no pseudocyst or abscess.  Mild periportal edema in the liver.  Common bile duct at 9 mm "which is within normal limits for age and similar back to prior exams such as December 14, 2010.  No definite filling defect in the CBD.  There is abnormal continued wall thickening of the gallbladder with some pericholecystic fluid tracking adjacent to the liver margin."  Past Medical History:  Diagnosis Date  . Asthma   . Cancer ([place])    breast  . Dementia ([place])   . Hearing loss   . Macular degeneration     Past Surgical History:  Procedure  Laterality Date  . MASTECTOMY      Prior to Admission medications   Not on File    Family History  Problem Relation Age of Onset  . Heart attack Mother   . Heart attack Father      Social History   Tobacco Use  . Smoking status: Never Smoker  . Smokeless tobacco: Never Used  Substance Use Topics  . Alcohol use: No  . Drug use: Not on file    Allergies as of 10/17/2017 - Review Complete 10/17/2017  Allergen Reaction Noted  . Penicillin g      Review of Systems:    All systems reviewed and negative except where noted in HPI.   Physical Exam:  Vital signs in last 24 hours: Vitals:   10/17/17 1730 10/17/17 1745 10/17/17 1801 10/17/17 2330  BP:   (!) 168/88 (!) 158/78  Pulse: (!) 102 (!) 104 71 81  Resp:   20 19  Temp:    98.6 F (37 C)  TempSrc:    Oral  SpO2: 94% 92% 93% 96%  Weight:      Height:         General:   Pleasant, cooperative in NAD Head:  Normocephalic and atraumatic. Eyes:   No icterus.   Conjunctiva pink. PERRLA. Ears:  Normal auditory acuity. Neck:  Supple; no masses or thyroidomegaly Lungs: Respirations even and unlabored. Lungs clear to auscultation bilaterally.  No wheezes, crackles, or rhonchi.  Abdomen:  Soft, nondistended, nontender. Normal bowel sounds. No appreciable masses or hepatomegaly.  No rebound or guarding.  Neurologic:  Alert and oriented x3;  grossly normal neurologically. Skin:  Intact without significant lesions or rashes. Cervical Nodes:  No significant cervical adenopathy. Psych:  Alert and cooperative. Normal affect.  LAB RESULTS: Recent Labs    10/17/17 1442 10/18/17 0506  WBC 10.0 13.6*  HGB 13.3 10.9*  HCT 41.6 34.3*  PLT 197 218   BMET Recent Labs    10/17/17 1442 10/18/17 0506  NA 140 143  K 4.4 4.2  CL 105 107  CO2 24 28  GLUCOSE 164* 83  BUN 17 17  CREATININE 1.01* 1.22*  CALCIUM 9.0 8.3*   LFT Recent Labs    10/18/17 0506  PROT 5.9*  ALBUMIN 3.2*  AST 502*  ALT 250*  ALKPHOS 287*    BILITOT 1.7*   PT/INR No results for input(s): LABPROT, INR in the last 72 hours.  STUDIES: Mr Abdomen Mrcp Wo Contrast  Result Date: 10/17/2017 CLINICAL DATA:  Elevated lipase level EXAM: MRI ABDOMEN WITHOUT CONTRAST  (INCLUDING MRCP) TECHNIQUE: Multiplanar multisequence MR imaging of the abdomen was performed. Heavily T2-weighted images of the biliary and pancreatic ducts were obtained, and three-dimensional MRCP images were rendered by post processing. COMPARISON:  Abdominal ultrasound of 10/17/2017. Abdominal MRI from 10/01/2017. FINDINGS: The patient terminated the exam prior to obtaining all of the series. She refused to continue. Despite efforts by the technologist and patient, motion artifact is present on today's exam and could not be eliminated. This reduces exam sensitivity and specificity. Lower chest: Mild scarring in the right lower lobe. Mild cardiomegaly. Hepatobiliary: Gallbladder wall thickening and pericholecystic fluid tracking adjacent to the anterior margin of the lateral segment left hepatic lobe. This is a roughly similar appearance to prior. The common bile duct measures up to 9 mm in diameter, stable by my measurement, without a well-defined filling defect. Somewhat abrupt tapering in the ampulla. No significant degree of intrahepatic biliary dilatation although there may be some mild periportal edema. The gallstones visible on ultrasound are surprisingly difficult to see on MRI, possibly due to motion. There may be a miniscule gallstone in the gallbladder neck on image 18/8 although this is equivocal. Pancreas: Mild peripancreatic edema. Borderline dilatation of the dorsal pancreatic duct in the pancreatic head and about 3 mm, similar to prior. No pancreatic pseudocyst or appreciable abscess. Spleen:  Unremarkable Adrenals/Urinary Tract: Adrenal glands normal. Bilateral peripelvic cysts. Stomach/Bowel: Scattered diverticula of the descending colon, more confluent diverticulosis  in the sigmoid colon. No appreciable dilated small bowel. Periampullary duodenal diverticulum noted. Vascular/Lymphatic: Aortoiliac atherosclerotic vascular disease. No pathologic adenopathy. Other:  Trace fluid in the right paracolic gutter. Musculoskeletal: Levoconvex scoliosis at the thoracolumbar junction with endplate compressions at T12 and L1 similar to prior. IMPRESSION: 1. Peripancreatic edema favoring acute pancreatitis. No pseudocyst or abscess identified. 2. Similar appearance of mild periportal edema in the liver, with a common bile duct at 9 mm which is within normal limits for age and similar back through prior exams such as 12/14/2010. No definite filling defect in the CBD. 3. There is abnormal continued wall thickening of the gallbladder, with some pericholecystic fluid tracking adjacent to the liver margin. This is not appreciably changed from prior and chronic cholecystitis is a distinct possibility. The gallstone seen on ultrasound are poorly seen by MRI. 4.  Aortic Atherosclerosis (ICD10-I70.0). 5. Sigmoid colon diverticulosis with scattered diverticula of  the descending colon. 6. Levoconvex lumbar scoliosis with chronic endplate compressions at T12 and L1. 7. Despite efforts by the technologist and patient, motion artifact is present on today's exam and could not be eliminated. This reduces exam sensitivity and specificity. 8. The patient terminated the exam prior to exam completion. Electronically Signed   By: Van Clines M.D.   On: 10/17/2017 22:00   US Abdomen Limited Ruq  Result Date: 10/17/2017 CLINICAL DATA:  Vomiting EXAM: ULTRASOUND ABDOMEN LIMITED RIGHT UPPER QUADRANT COMPARISON:  10/01/2017 MRI abdomen and abdominal sonogram. FINDINGS: Gallbladder: Distended gallbladder. Moderate diffuse gallbladder wall thickening. Layering subcentimeter gallstones. Small amount of pericholecystic fluid. Small amount of sludge. No sonographic Murphy's sign. Common bile duct: Diameter: 8 mm  Liver: No focal lesion identified. Within normal limits in parenchymal echogenicity. Portal vein is patent on color Doppler imaging with normal direction of blood flow towards the liver. IMPRESSION: 1. Cholelithiasis. Distended gallbladder. Moderate diffuse gallbladder wall thickening. Small amount of pericholecystic fluid. Findings are suspicious for acute cholecystitis, although no sonographic Percell Miller sign was reported. Hepatobiliary scintigraphy study may be considered for further evaluation, as clinically warranted. 2. Chronic dilatation of the common bile duct (8 mm diameter), unchanged from recent MRI and ultrasound studies. 3. Normal liver. Electronically Signed   By: Ilona Sorrel M.D.   On: 10/17/2017 17:05      Impression / Plan:   Brandi Nelson is a 84 y.o. y/o female with gallstone pancreatitis  No filling defects or choledocholithiasis reported on MRCP No indications for ERCP.  In addition, ERCP in the setting of acute pancreatitis can worsen the condition.   No walled off fluid collection to indicate drainage  Timing of cholecystectomy to be determined by surgery team who are following Given her age and comorbidities, this is a difficult situation  Continue IV fluids, lactated Ringer's preferred.  The rate of 250 cc to 300 cc an hour for 24 to 48 hours is usually recommended, but given her age, the rate will need to be closely monitored and changed accordingly.  Dr. Lysle Pearl had asked me about stenting to prevent future episodes of pancreatitis. Pancreatic stents are usually done in cases of chronic pancreatitis and in cases where pancreatic duct strictures are leading to pancreatitis.  This is not the case here.  In addition, ERCP at this time can worsen her pancreatitis, there is no indication for ERCP at this time, and the procedure would have higher risks than benefits.  No choledocholithiasis seen, therefore no indication for CBD stent at this time either.  Continue pain  control as necessary  When patient tolerates, enteric nutrition is important for pancreatitis as well start with clear liquid diet when patient is able to eat, and then advance to low-fat diet and patient tolerates. Before starting diet, would confirm with surgery about the surgical plans.  Dr. Lysle Pearl was considering speaking to tertiary care center about transfer there. Primary team to discuss this further with him.   Continue daily CMP I will order serology for further liver work-up to rule out acute hepatitis Avoid hypotension and dehydration as elevated transaminases could be due to ischemic hepatitis given that patient has had 2 episodes of pancreatitis and decreased p.o. intake due to within the last 1 to 2 months.  Medication list reviewed, patient was only taking a medication for dementia, Aricept, and has not taken that in the last 2 to 3 months as per son.  Therefore, unlikely to be medication induced pancreatitis.  We will  check triglyceride levels as well.  Thank you for involving me in the care of this patient.      LOS: 1 day   Virgel Manifold, MD  10/18/2017, 9:49 AM

## 2017-10-18 NOTE — Progress Notes (Signed)
Discussed with Dr. Morey Hummingbird of surgery department [facility].  Patient has been accepted at [facility].

## 2017-10-18 NOTE — Discharge Planning (Addendum)
Brandi Nelson at [place] NAME: Brandi Nelson    MR#:  [id number]  DATE OF BIRTH:  Nov 01, 1907  DATE OF ADMISSION:  10/17/2017 ADMITTING PHYSICIAN: Loletha Grayer, MD  DATE OF DISCHARGE: 10/19/2017  PRIMARY CARE PHYSICIAN: Baxter Hire, MD   ADMISSION DIAGNOSIS:  Vomiting [R11.10] Abdominal pain, unspecified abdominal location [R10.9]  DISCHARGE DIAGNOSIS:  Active Problems:   Pancreatitis   SECONDARY DIAGNOSIS:   Past Medical History:  Diagnosis Date  . Asthma   . Cancer ([place])    breast  . Dementia ([place])   . Hearing loss   . Macular degeneration      ADMITTING HISTORY  HISTORY OF PRESENT ILLNESS:  Brandi Nelson  is a 84 y.o. female with a known history of dementia.  The patient is not the best historian.  Cannot remember that she was sick at home.  She was recently in the hospital for pancreatitis from gallbladder source.  Because of her age she was treated conservatively.  As per the son, she was vomiting when he came to pick her up around noon.  She had some pain in her abdomen and some abdominal distention she got hot and was short of breath.  The patient cannot recall any of the symptoms.   HOSPITAL COURSE:    * Acute pancreatitis with chronic cholecystitis Started on IV ceftriaxone and Flagyl. Lipase trending down from 3000-800. MRCP done and shows chronic cholecystitis.  No choledocholithiasis Discussed with Dr. Lysle Pearl who suggested transfer to [facility] due to advanced age and complicated nature. Discussed with Dr. Morey Hummingbird at [facility] who has graciously accepted the patient in transfer.  * Dementia. PRN Haldol while here in the hospital.  Transfer once bed available.  CONSULTS OBTAINED:  Treatment Team:  Benjamine Sprague, DO Virgel Manifold, MD  DRUG ALLERGIES:   Allergies  Allergen Reactions  . Penicillin G     Has patient had a PCN reaction causing immediate rash,  facial/tongue/throat swelling, SOB or lightheadedness with hypotension: no  Has patient had a PCN reaction causing severe rash involving mucus membranes or skin necrosis: no Has patient had a PCN reaction that required hospitalization no Has patient had a PCN reaction occurring within the last 10 years: no If all of the above answers are "NO", then may proceed with Cephalosporin use.     DISCHARGE MEDICATIONS:   Allergies as of 10/18/2017      Reactions   Penicillin G    Has patient had a PCN reaction causing immediate rash, facial/tongue/throat swelling, SOB or lightheadedness with hypotension: no Has patient had a PCN reaction causing severe rash involving mucus membranes or skin necrosis: no Has patient had a PCN reaction that required hospitalization no Has patient had a PCN reaction occurring within the last 10 years: no If all of the above answers are "NO", then may proceed with Cephalosporin use.      Medication List    You have not been prescribed any medications.     Today   VITAL SIGNS:  Blood pressure (!) 140/55, pulse 91, temperature 97.8 F (36.6 C), resp. rate 16, height 4\' 11"  (1.499 m), weight 58.9 kg, SpO2 94 %.  I/O:   No intake or output data in the 24 hours ending 10/19/17 0957  PHYSICAL EXAMINATION:  Physical Exam  GENERAL:  84 y.o.-year-old patient lying in the bed with no acute distress.  LUNGS: Normal breath sounds bilaterally, no wheezing, rales CARDIOVASCULAR: S1,  S2 normal. No murmurs, rubs, or gallops.  ABDOMEN: Soft, tender, non-distended. Bowel sounds present. No organomegaly or mass.  NEUROLOGIC: Moves all 4 extremities. PSYCHIATRIC: The patient is awake but not talking much SKIN: bruising on arms  DATA REVIEW:   CBC Recent Labs  Lab 10/18/17 0506  WBC 13.6*  HGB 10.9*  HCT 34.3*  PLT 218    Chemistries  Recent Labs  Lab 10/18/17 0506  NA 143  K 4.2  CL 107  CO2 28  GLUCOSE 83  BUN 17  CREATININE 1.22*  CALCIUM  8.3*  AST 502*  ALT 250*  ALKPHOS 287*  BILITOT 1.7*    Cardiac Enzymes Recent Labs  Lab 10/17/17 [place] <0.03    Microbiology Results  No results found for this or any previous visit.  RADIOLOGY:  Mr Abdomen Mrcp Wo Contrast  Result Date: 10/17/2017 CLINICAL DATA:  Elevated lipase level EXAM: MRI ABDOMEN WITHOUT CONTRAST  (INCLUDING MRCP) TECHNIQUE: Multiplanar multisequence MR imaging of the abdomen was performed. Heavily T2-weighted images of the biliary and pancreatic ducts were obtained, and three-dimensional MRCP images were rendered by post processing. COMPARISON:  Abdominal ultrasound of 10/17/2017. Abdominal MRI from 10/01/2017. FINDINGS: The patient terminated the exam prior to obtaining all of the series. She refused to continue. Despite efforts by the technologist and patient, motion artifact is present on today's exam and could not be eliminated. This reduces exam sensitivity and specificity. Lower chest: Mild scarring in the right lower lobe. Mild cardiomegaly. Hepatobiliary: Gallbladder wall thickening and pericholecystic fluid tracking adjacent to the anterior margin of the lateral segment left hepatic lobe. This is a roughly similar appearance to prior. The common bile duct measures up to 9 mm in diameter, stable by my measurement, without a well-defined filling defect. Somewhat abrupt tapering in the ampulla. No significant degree of intrahepatic biliary dilatation although there may be some mild periportal edema. The gallstones visible on ultrasound are surprisingly difficult to see on MRI, possibly due to motion. There may be a miniscule gallstone in the gallbladder neck on image 18/8 although this is equivocal. Pancreas: Mild peripancreatic edema. Borderline dilatation of the dorsal pancreatic duct in the pancreatic head and about 3 mm, similar to prior. No pancreatic pseudocyst or appreciable abscess. Spleen:  Unremarkable Adrenals/Urinary Tract: Adrenal glands  normal. Bilateral peripelvic cysts. Stomach/Bowel: Scattered diverticula of the descending colon, more confluent diverticulosis in the sigmoid colon. No appreciable dilated small bowel. Periampullary duodenal diverticulum noted. Vascular/Lymphatic: Aortoiliac atherosclerotic vascular disease. No pathologic adenopathy. Other:  Trace fluid in the right paracolic gutter. Musculoskeletal: Levoconvex scoliosis at the thoracolumbar junction with endplate compressions at T12 and L1 similar to prior. IMPRESSION: 1. Peripancreatic edema favoring acute pancreatitis. No pseudocyst or abscess identified. 2. Similar appearance of mild periportal edema in the liver, with a common bile duct at 9 mm which is within normal limits for age and similar back through prior exams such as 12/14/2010. No definite filling defect in the CBD. 3. There is abnormal continued wall thickening of the gallbladder, with some pericholecystic fluid tracking adjacent to the liver margin. This is not appreciably changed from prior and chronic cholecystitis is a distinct possibility. The gallstone seen on ultrasound are poorly seen by MRI. 4.  Aortic Atherosclerosis (ICD10-I70.0). 5. Sigmoid colon diverticulosis with scattered diverticula of the descending colon. 6. Levoconvex lumbar scoliosis with chronic endplate compressions at T12 and L1. 7. Despite efforts by the technologist and patient, motion artifact is present on today's exam and could not  be eliminated. This reduces exam sensitivity and specificity. 8. The patient terminated the exam prior to exam completion. Electronically Signed   By: Van Clines M.D.   On: 10/17/2017 22:00   US Abdomen Limited Ruq  Result Date: 10/17/2017 CLINICAL DATA:  Vomiting EXAM: ULTRASOUND ABDOMEN LIMITED RIGHT UPPER QUADRANT COMPARISON:  10/01/2017 MRI abdomen and abdominal sonogram. FINDINGS: Gallbladder: Distended gallbladder. Moderate diffuse gallbladder wall thickening. Layering subcentimeter  gallstones. Small amount of pericholecystic fluid. Small amount of sludge. No sonographic Murphy's sign. Common bile duct: Diameter: 8 mm Liver: No focal lesion identified. Within normal limits in parenchymal echogenicity. Portal vein is patent on color Doppler imaging with normal direction of blood flow towards the liver. IMPRESSION: 1. Cholelithiasis. Distended gallbladder. Moderate diffuse gallbladder wall thickening. Small amount of pericholecystic fluid. Findings are suspicious for acute cholecystitis, although no sonographic Percell Miller sign was reported. Hepatobiliary scintigraphy study may be considered for further evaluation, as clinically warranted. 2. Chronic dilatation of the common bile duct (8 mm diameter), unchanged from recent MRI and ultrasound studies. 3. Normal liver. Electronically Signed   By: Ilona Sorrel M.D.   On: 10/17/2017 17:05    Follow up with PCP in 1 week.  Management plans discussed with the patient, family and they are in agreement.  CODE STATUS:     Code Status Orders  (From admission, onward)         Start     Ordered   10/17/17 1755  Do not attempt resuscitation (DNR)  Continuous    Question Answer Comment  In the event of cardiac or respiratory ARREST Do not call a "code blue"   In the event of cardiac or respiratory ARREST Do not perform Intubation, CPR, defibrillation or ACLS   In the event of cardiac or respiratory ARREST Use medication by any route, position, wound care, and other measures to relive pain and suffering. May use oxygen, suction and manual treatment of airway obstruction as needed for comfort.   Comments nurse may pronounce      10/17/17 1755        Code Status History    Date Active Date Inactive Code Status Order ID Comments User Context   10/02/2017 0104 10/04/2017 1501 DNR [id number]  Lance Coon, MD Inpatient   11/25/2016 1243 11/26/2016 1612 DNR [id number]  Idelle Crouch, MD Inpatient   11/29/2015 2210 12/01/2015 1706 Full Code  [id number]  Gladstone Lighter, MD Inpatient   08/30/2014 1814 08/31/2014 2011 DNR [id number]  Idelle Crouch, MD Inpatient    Advance Directive Documentation     Most Recent Value  Type of Advance Directive  Healthcare Power of Attorney, Living will  Pre-existing out of facility DNR order (yellow form or pink MOST form)  -  "MOST" Form in Place?  -      TOTAL TIME TAKING CARE OF THIS PATIENT ON DAY OF DISCHARGE: more than 30 minutes.   Leia Alf Eevee Borbon M.D on 10/19/2017 at 9:57 AM  Between 7am to 6pm - Pager - [phone]  After 6pm go to [email or web address] - password [email or web address] [facility]  Office  [phone]  CC: Primary care physician; Baxter Hire, MD  Note: This dictation was prepared with Dragon dictation along with smaller phrase technology. Any transcriptional errors that result from this process are unintentional.

## 2017-10-18 NOTE — Consult Note (Signed)
Brandi Nelson is a 84 year old female with a history of Asthma, Cancer ([place]), Dementia, Hearing loss and Macular degeneration seen for anesthesia evaluation after speaking with Dr. Lysle Pearl.  I agree with Dr. Lysle Pearl that due to this patients age and medical history that she is higher risk from an anesthesia standpoint and that she could receive more appropriate care at a larger hospital with more resources.  This was explained to the family who voiced understanding.

## 2017-10-18 NOTE — Progress Notes (Addendum)
[facility] NAME: Tamalyn Wadsworth    MR#:  [id number]  DATE OF BIRTH:  [birth year]  SUBJECTIVE:  CHIEF COMPLAINT:   Chief Complaint  Patient presents with  . Abdominal Pain   Patient drowsy and unable to contribute to history.  Confused.  REVIEW OF SYSTEMS:   Review of Systems  Unable to perform ROS: Dementia   DRUG ALLERGIES:   Allergies  Allergen Reactions  . Penicillin G     Has patient had a PCN reaction causing immediate rash, facial/tongue/throat swelling, SOB or lightheadedness with hypotension: no  Has patient had a PCN reaction causing severe rash involving mucus membranes or skin necrosis: no Has patient had a PCN reaction that required hospitalization no Has patient had a PCN reaction occurring within the last 10 years: no If all of the above answers are "NO", then may proceed with Cephalosporin use.     VITALS:  Blood pressure (!) 158/78, pulse 81, temperature 98.6 F (37 C), temperature source Oral, resp. rate 19, height 4\' 11"  (1.499 m), weight 58.9 kg, SpO2 96 %.  PHYSICAL EXAMINATION:   Physical Exam  GENERAL:  84 y.o.-year-old patient lying in the bed with no acute distress.  EYES: Pupils equal, round, reactive to light and accommodation. No scleral icterus. Extraocular muscles intact.  HEENT: Head atraumatic, normocephalic. Oropharynx and nasopharynx clear.  NECK:  Supple, no jugular venous distention. No thyroid enlargement, no tenderness.  LUNGS: Normal breath sounds bilaterally, no wheezing, rales, rhonchi. No use of accessory muscles of respiration.  CARDIOVASCULAR: S1, S2 normal. No murmurs, rubs, or gallops.  ABDOMEN: Soft, nontender, nondistended. Bowel sounds present. No organomegaly or mass.  EXTREMITIES: No cyanosis, clubbing or edema b/l.    NEUROLOGIC: Moves all 4 extremities  PSYCHIATRIC: The patient is drowzy SKIN: No obvious rash, lesion, or ulcer.   LABORATORY PANEL:   CBC Recent  Labs  Lab 10/18/17 0506  WBC 13.6*  HGB 10.9*  HCT 34.3*  PLT 218   ------------------------------------------------------------------------------------------------------------------ Chemistries  Recent Labs  Lab 10/18/17 0506  NA 143  K 4.2  CL 107  CO2 28  GLUCOSE 83  BUN 17  CREATININE 1.22*  CALCIUM 8.3*  AST 502*  ALT 250*  ALKPHOS 287*  BILITOT 1.7*  ----------------------------------------------------------------------------------------------------------------- Cardiac Enzymes Recent Labs  Lab 10/17/17 1442  TROPONINI <0.03  ----------------------------------------------------------------------------------------------------------------- RADIOLOGY:  Mr Abdomen Mrcp Wo Contrast  Result Date: 10/17/2017 CLINICAL DATA:  Elevated lipase level EXAM: MRI ABDOMEN WITHOUT CONTRAST  (INCLUDING MRCP) TECHNIQUE: Multiplanar multisequence MR imaging of the abdomen was performed. Heavily T2-weighted images of the biliary and pancreatic ducts were obtained, and three-dimensional MRCP images were rendered by post processing. COMPARISON:  Abdominal ultrasound of 10/17/2017. Abdominal MRI from 10/01/2017. FINDINGS: The patient terminated the exam prior to obtaining all of the series. She refused to continue. Despite efforts by the technologist and patient, motion artifact is present on today's exam and could not be eliminated. This reduces exam sensitivity and specificity. Lower chest: Mild scarring in the right lower lobe. Mild cardiomegaly. Hepatobiliary: Gallbladder wall thickening and pericholecystic fluid tracking adjacent to the anterior margin of the lateral segment left hepatic lobe. This is a roughly similar appearance to prior. The common bile duct measures up to 9 mm in diameter, stable by my measurement, without a well-defined filling defect. Somewhat abrupt tapering in the ampulla. No significant degree of intrahepatic biliary dilatation although there may be some mild periportal  edema. The gallstones visible on  ultrasound are surprisingly difficult to see on MRI, possibly due to motion. There may be a miniscule gallstone in the gallbladder neck on image 18/8 although this is equivocal. Pancreas: Mild peripancreatic edema. Borderline dilatation of the dorsal pancreatic duct in the pancreatic head and about 3 mm, similar to prior. No pancreatic pseudocyst or appreciable abscess. Spleen:  Unremarkable Adrenals/Urinary Tract: Adrenal glands normal. Bilateral peripelvic cysts. Stomach/Bowel: Scattered diverticula of the descending colon, more confluent diverticulosis in the sigmoid colon. No appreciable dilated small bowel. Periampullary duodenal diverticulum noted. Vascular/Lymphatic: Aortoiliac atherosclerotic vascular disease. No pathologic adenopathy. Other:  Trace fluid in the right paracolic gutter. Musculoskeletal: Levoconvex scoliosis at the thoracolumbar junction with endplate compressions at T12 and L1 similar to prior. IMPRESSION: 1. Peripancreatic edema favoring acute pancreatitis. No pseudocyst or abscess identified. 2. Similar appearance of mild periportal edema in the liver, with a common bile duct at 9 mm which is within normal limits for age and similar back through prior exams such as 12/14/2010. No definite filling defect in the CBD. 3. There is abnormal continued wall thickening of the gallbladder, with some pericholecystic fluid tracking adjacent to the liver margin. This is not appreciably changed from prior and chronic cholecystitis is a distinct possibility. The gallstone seen on ultrasound are poorly seen by MRI. 4.  Aortic Atherosclerosis (ICD10-I70.0). 5. Sigmoid colon diverticulosis with scattered diverticula of the descending colon. 6. Levoconvex lumbar scoliosis with chronic endplate compressions at T12 and L1. 7. Despite efforts by the technologist and patient, motion artifact is present on today's exam and could not be eliminated. This reduces exam sensitivity and  specificity. 8. The patient terminated the exam prior to exam completion. Electronically Signed   By: Van Clines M.D.   On: 10/17/2017 22:00   US Abdomen Limited Ruq  Result Date: 10/17/2017 CLINICAL DATA:  Vomiting EXAM: ULTRASOUND ABDOMEN LIMITED RIGHT UPPER QUADRANT COMPARISON:  10/01/2017 MRI abdomen and abdominal sonogram. FINDINGS: Gallbladder: Distended gallbladder. Moderate diffuse gallbladder wall thickening. Layering subcentimeter gallstones. Small amount of pericholecystic fluid. Small amount of sludge. No sonographic Murphy's sign. Common bile duct: Diameter: 8 mm Liver: No focal lesion identified. Within normal limits in parenchymal echogenicity. Portal vein is patent on color Doppler imaging with normal direction of blood flow towards the liver. IMPRESSION: 1. Cholelithiasis. Distended gallbladder. Moderate diffuse gallbladder wall thickening. Small amount of pericholecystic fluid. Findings are suspicious for acute cholecystitis, although no sonographic Percell Miller sign was reported. Hepatobiliary scintigraphy study may be considered for further evaluation, as clinically warranted. 2. Chronic dilatation of the common bile duct (8 mm diameter), unchanged from recent MRI and ultrasound studies. 3. Normal liver. Electronically Signed   By: Ilona Sorrel M.D.   On: 10/17/2017 17:05   ASSESSMENT AND PLAN:    *  Acute pancreatitis with likely gallbladder source. Empiric antibiotics.    Continue to monitor lipase.   MRCP done and shows chronic cholecystitis. Not a good candidate for surgery.  Discussed with son at bedside.  Acid surgery Dr. Lysle Pearl.  Advised transfer to [facility] for cholecystectomy.  *  Elevated liver function tests.   Repeat CMP in AM  *  Dementia.  PRN Haldol while here in the hospital.  All the records are reviewed and case discussed with Care Management/Social Worker Management plans discussed with the patient, family and they are in agreement.  CODE  STATUS: DNR  DVT Prophylaxis: SCDs  TOTAL TIME TAKING CARE OF THIS PATIENT: 35 minutes.   POSSIBLE D/C IN 1-2 DAYS, DEPENDING  ON CLINICAL CONDITION.  Leia Alf Trysta Showman M.D on 10/18/2017 at 1:35 PM  Between 7am to 6pm - Pager - [phone]  After 6pm go to [email or web address] - password [email or web address] [facility]  Office  [phone]  CC: Primary care physician; Baxter Hire, MD  Note: This dictation was prepared with Dragon dictation along with smaller phrase technology. Any transcriptional errors that result from this process are unintentional.

## 2017-10-18 NOTE — Progress Notes (Signed)
Subjective:  CC:  Brandi Nelson is a 84 y.o. female  Hospital stay day 1,   gallstone pancreatitis  HPI: No issues overnight.  ROS:  A 5 point review of systems was performed and pertinent positives and negatives noted in HPI.   Objective:      Temp:  [98.6 F (37 C)] 98.6 F (37 C) (10/09 2330) Pulse Rate:  [71-104] 81 (10/09 2330) Resp:  [19-20] 19 (10/09 2330) BP: (158-168)/(78-88) 158/78 (10/09 2330) SpO2:  [92 %-97 %] 96 % (10/09 2330)     Height: 4\' 11"  (149.9 cm) Weight: 58.9 kg BMI (Calculated): 26.22   Intake/Output this shift:  No intake/output data recorded.       Constitutional :  distracted and no distress  Respiratory:  clear to auscultation bilaterally  Cardiovascular:  regular rate and rhythm  Gastrointestinal: soft, no tenderness, but more bloated than previous exam.  .   Skin: Cool and moist.   Psychiatric: Normal affect, non-agitated, not confused       LABS:  CMP Latest Ref Rng & Units 10/18/2017 10/17/2017 10/04/2017  Glucose 70 - 99 mg/dL 83 164(H) 75  BUN 8 - 23 mg/dL 17 17 21   Creatinine 0.44 - 1.00 mg/dL 1.22(H) 1.01(H) 0.82  Sodium 135 - 145 mmol/L 143 140 142  Potassium 3.5 - 5.1 mmol/L 4.2 4.4 3.6  Chloride 98 - 111 mmol/L 107 105 110  CO2 22 - 32 mmol/L 28 24 23   Calcium 8.9 - 10.3 mg/dL 8.3(L) 9.0 8.6(L)  Total Protein 6.5 - 8.1 g/dL 5.9(L) 6.8 6.0(L)  Total Bilirubin 0.3 - 1.2 mg/dL 1.7(H) 1.5(H) 1.0  Alkaline Phos 38 - 126 U/L 287(H) 228(H) 269(H)  AST 15 - 41 U/L 502(H) 129(H) 42(H)  ALT 0 - 44 U/L 250(H) 45(H) 79(H)   CBC Latest Ref Rng & Units 10/18/2017 10/17/2017 10/04/2017  WBC 4.0 - 10.5 K/uL 13.6(H) 10.0 7.2  Hemoglobin 12.0 - 15.0 g/dL 10.9(L) 13.3 11.6(L)  Hematocrit 36.0 - 46.0 % 34.3(L) 41.6 33.2(L)  Platelets 150 - 400 K/uL 218 197 275    RADS: n/a Assessment:   Gallstone pancreatitis.  Discussed the high risk of patient undergoing any sort of surgical intervention at this time with the power of attorney.  GI  at this point also does not want to provide any intervention.  Anesthesia provided preoperative assessment and also deemed her at an extremely high risk for complications which potentially will not be able to be addressed at her current facility.  With the potential of inadequate ancillary support for postop complications, in addition to no other intervention at this point to prevent future gallstone pancreatitis episodes except for gallbladder surgery, I recommended that the patient be transferred to tertiary care center who can provide full support in the perioperative timeframe for such high risk patients.  Family members agreed with the plan and requested to be transferred to [facility].  Discussed case with Dr. Darvin Neighbours primary and he agrees with plan and will arrange the transfer.  Continue IV fluid resuscitation and antibiotics for the possible concurrent acute cholecystitis. no further recommendations from my standpoint.

## 2017-10-19 DIAGNOSIS — K660 Peritoneal adhesions (postprocedural) (postinfection): Secondary | ICD-10-CM | POA: Diagnosis not present

## 2017-10-19 DIAGNOSIS — K8012 Calculus of gallbladder with acute and chronic cholecystitis without obstruction: Secondary | ICD-10-CM | POA: Diagnosis not present

## 2017-10-19 DIAGNOSIS — I10 Essential (primary) hypertension: Secondary | ICD-10-CM | POA: Diagnosis not present

## 2017-10-19 DIAGNOSIS — Z853 Personal history of malignant neoplasm of breast: Secondary | ICD-10-CM | POA: Diagnosis not present

## 2017-10-19 DIAGNOSIS — J45909 Unspecified asthma, uncomplicated: Secondary | ICD-10-CM | POA: Diagnosis not present

## 2017-10-19 DIAGNOSIS — I4510 Unspecified right bundle-branch block: Secondary | ICD-10-CM | POA: Diagnosis not present

## 2017-10-19 DIAGNOSIS — Z79899 Other long term (current) drug therapy: Secondary | ICD-10-CM | POA: Diagnosis not present

## 2017-10-19 DIAGNOSIS — Z66 Do not resuscitate: Secondary | ICD-10-CM | POA: Diagnosis not present

## 2017-10-19 DIAGNOSIS — F0280 Dementia in other diseases classified elsewhere without behavioral disturbance: Secondary | ICD-10-CM | POA: Diagnosis not present

## 2017-10-19 DIAGNOSIS — K812 Acute cholecystitis with chronic cholecystitis: Secondary | ICD-10-CM | POA: Diagnosis not present

## 2017-10-19 DIAGNOSIS — I6529 Occlusion and stenosis of unspecified carotid artery: Secondary | ICD-10-CM | POA: Diagnosis not present

## 2017-10-19 DIAGNOSIS — Z880 Allergy status to penicillin: Secondary | ICD-10-CM | POA: Diagnosis not present

## 2017-10-19 DIAGNOSIS — G309 Alzheimer's disease, unspecified: Secondary | ICD-10-CM | POA: Diagnosis not present

## 2017-10-19 DIAGNOSIS — K5790 Diverticulosis of intestine, part unspecified, without perforation or abscess without bleeding: Secondary | ICD-10-CM | POA: Diagnosis not present

## 2017-10-19 DIAGNOSIS — Z7982 Long term (current) use of aspirin: Secondary | ICD-10-CM | POA: Diagnosis not present

## 2017-10-19 DIAGNOSIS — I2510 Atherosclerotic heart disease of native coronary artery without angina pectoris: Secondary | ICD-10-CM | POA: Diagnosis not present

## 2017-10-19 DIAGNOSIS — F05 Delirium due to known physiological condition: Secondary | ICD-10-CM | POA: Diagnosis not present

## 2017-10-19 DIAGNOSIS — K851 Biliary acute pancreatitis without necrosis or infection: Secondary | ICD-10-CM | POA: Diagnosis not present

## 2017-10-19 DIAGNOSIS — K8020 Calculus of gallbladder without cholecystitis without obstruction: Secondary | ICD-10-CM | POA: Diagnosis not present

## 2017-10-19 LAB — HEPATITIS PANEL, ACUTE
HEP A IGM: NEGATIVE
Hep B C IgM: NEGATIVE
Hepatitis B Surface Ag: NEGATIVE

## 2017-10-19 NOTE — Progress Notes (Signed)
Vonda Antigua, MD [address], [place], [place], [place], [address] [address], [place], [place], [place], [address] Phone: [phone]  Fax: [phone]   Subjective:  Patient resting in bed comfortably.  Family at bedside.  No nausea or vomiting.  No complaints at this time.  Objective: Exam: Vital signs in last 24 hours: Vitals:   10/18/17 1621 10/18/17 2353 10/19/17 0545 10/19/17 0859  BP: (!) 120/59 (!) 165/98 (!) 140/55 138/70  Pulse: 81 (!) 104 91 90  Resp:  (!) 22 16 18   Temp: (!) 97.5 F (36.4 C) (!) 97.5 F (36.4 C) 97.8 F (36.6 C) 97.7 F (36.5 C)  TempSrc: Oral Oral    SpO2: 95% 100% 94% 94%  Weight:      Height:       Weight change:  No intake or output data in the 24 hours ending 10/19/17 1018  General: No acute distress, AAO x3 Abd: Soft, NT/ND, No HSM Skin: Warm, no rashes Neck: Supple, Trachea midline   Lab Results: Lab Results  Component Value Date   WBC 13.6 (H) 10/18/2017   HGB 10.9 (L) 10/18/2017   HCT 34.3 (L) 10/18/2017   MCV 94.0 10/18/2017   PLT 218 10/18/2017   Micro Results: No results found for this or any previous visit (from the past 240 hour(s)). Studies/Results: Mr Abdomen Mrcp Wo Contrast  Result Date: 10/17/2017 CLINICAL DATA:  Elevated lipase level EXAM: MRI ABDOMEN WITHOUT CONTRAST  (INCLUDING MRCP) TECHNIQUE: Multiplanar multisequence MR imaging of the abdomen was performed. Heavily T2-weighted images of the biliary and pancreatic ducts were obtained, and three-dimensional MRCP images were rendered by post processing. COMPARISON:  Abdominal ultrasound of 10/17/2017. Abdominal MRI from 10/01/2017. FINDINGS: The patient terminated the exam prior to obtaining all of the series. She refused to continue. Despite efforts by the technologist and patient, motion artifact is present on today's exam and could not be eliminated. This reduces exam sensitivity and specificity. Lower chest: Mild scarring in the right lower  lobe. Mild cardiomegaly. Hepatobiliary: Gallbladder wall thickening and pericholecystic fluid tracking adjacent to the anterior margin of the lateral segment left hepatic lobe. This is a roughly similar appearance to prior. The common bile duct measures up to 9 mm in diameter, stable by my measurement, without a well-defined filling defect. Somewhat abrupt tapering in the ampulla. No significant degree of intrahepatic biliary dilatation although there may be some mild periportal edema. The gallstones visible on ultrasound are surprisingly difficult to see on MRI, possibly due to motion. There may be a miniscule gallstone in the gallbladder neck on image 18/8 although this is equivocal. Pancreas: Mild peripancreatic edema. Borderline dilatation of the dorsal pancreatic duct in the pancreatic head and about 3 mm, similar to prior. No pancreatic pseudocyst or appreciable abscess. Spleen:  Unremarkable Adrenals/Urinary Tract: Adrenal glands normal. Bilateral peripelvic cysts. Stomach/Bowel: Scattered diverticula of the descending colon, more confluent diverticulosis in the sigmoid colon. No appreciable dilated small bowel. Periampullary duodenal diverticulum noted. Vascular/Lymphatic: Aortoiliac atherosclerotic vascular disease. No pathologic adenopathy. Other:  Trace fluid in the right paracolic gutter. Musculoskeletal: Levoconvex scoliosis at the thoracolumbar junction with endplate compressions at T12 and L1 similar to prior. IMPRESSION: 1. Peripancreatic edema favoring acute pancreatitis. No pseudocyst or abscess identified. 2. Similar appearance of mild periportal edema in the liver, with a common bile duct at 9 mm which is within normal limits for age and similar back through prior exams such as 12/14/2010. No definite filling defect in the CBD. 3.  There is abnormal continued wall thickening of the gallbladder, with some pericholecystic fluid tracking adjacent to the liver margin. This is not appreciably changed  from prior and chronic cholecystitis is a distinct possibility. The gallstone seen on ultrasound are poorly seen by MRI. 4.  Aortic Atherosclerosis (ICD10-I70.0). 5. Sigmoid colon diverticulosis with scattered diverticula of the descending colon. 6. Levoconvex lumbar scoliosis with chronic endplate compressions at T12 and L1. 7. Despite efforts by the technologist and patient, motion artifact is present on today's exam and could not be eliminated. This reduces exam sensitivity and specificity. 8. The patient terminated the exam prior to exam completion. Electronically Signed   By: Van Clines M.D.   On: 10/17/2017 22:00   US Abdomen Limited Ruq  Result Date: 10/17/2017 CLINICAL DATA:  Vomiting EXAM: ULTRASOUND ABDOMEN LIMITED RIGHT UPPER QUADRANT COMPARISON:  10/01/2017 MRI abdomen and abdominal sonogram. FINDINGS: Gallbladder: Distended gallbladder. Moderate diffuse gallbladder wall thickening. Layering subcentimeter gallstones. Small amount of pericholecystic fluid. Small amount of sludge. No sonographic Murphy's sign. Common bile duct: Diameter: 8 mm Liver: No focal lesion identified. Within normal limits in parenchymal echogenicity. Portal vein is patent on color Doppler imaging with normal direction of blood flow towards the liver. IMPRESSION: 1. Cholelithiasis. Distended gallbladder. Moderate diffuse gallbladder wall thickening. Small amount of pericholecystic fluid. Findings are suspicious for acute cholecystitis, although no sonographic Percell Miller sign was reported. Hepatobiliary scintigraphy study may be considered for further evaluation, as clinically warranted. 2. Chronic dilatation of the common bile duct (8 mm diameter), unchanged from recent MRI and ultrasound studies. 3. Normal liver. Electronically Signed   By: Ilona Sorrel M.D.   On: 10/17/2017 17:05   Medications:  Scheduled Meds: Continuous Infusions: . sodium chloride 40 mL/hr at 10/18/17 1558  . cefTRIAXone (ROCEPHIN)  IV 1 g  (10/18/17 1608)  . metronidazole 500 mg (10/19/17 0846)   PRN Meds:.acetaminophen **OR** acetaminophen, haloperidol lactate, ondansetron **OR** ondansetron (ZOFRAN) IV, oxyCODONE   Assessment: Active Problems:   Pancreatitis    Plan: Dr. Lysle Pearl has discussed transferring the patient to tertiary care center with Dr. Darvin Neighbours Plan is to transfer patient [facility] and she has been accepted there No indication for ERCP Antimitochondrial, anti-smooth muscle antibody pending Ferritin normal, triglycerides normal, acute hepatitis panel normal Monitor CMP daily  Dr. Alice Reichert is on call, and patient was signed out to him and he will be following the patient if she is still here, otherwise she will be followed at [facility].  Please page him and with any questions.   LOS: 2 days   Vonda Antigua, MD 10/19/2017, 10:18 AM

## 2017-10-19 NOTE — Discharge Summary (Signed)
[place] [facility] [place] NAME: [name]    MR#:  [id number]  DATE OF BIRTH:  04/15/1907  DATE OF ADMISSION:  10/17/2017    ADMITTING PHYSICIAN: Loletha Grayer, MD  DATE OF DISCHARGE: 10/19/2017  PRIMARY CARE PHYSICIAN: Baxter Hire, MD   ADMISSION DIAGNOSIS:  Vomiting [R11.10] Abdominal pain, unspecified abdominal location [R10.9]  DISCHARGE DIAGNOSIS:  Active Problems:   Pancreatitis   SECONDARY DIAGNOSIS:       Past Medical History:  Diagnosis Date  . Asthma   . Cancer ([place])    breast  . Dementia ([place])   . Hearing loss   . Macular degeneration      ADMITTING HISTORY  HISTORY OF PRESENT ILLNESS:  [name] a89 y.o.femalewith a known history of dementia. The patient is not the best historian. Cannot remember that she was sick at home. She was recently in the hospital for pancreatitis from gallbladder source. Because of her age she was treated conservatively. As per the son, she was vomiting when he came to pick her up around noon. She had some pain in her abdomen and some abdominal distention she got hot and was short of breath. The patient cannot recall any of the symptoms.   HOSPITAL COURSE:   *Acute pancreatitis with chronic cholecystitis Started on IV ceftriaxone and Flagyl. Lipase trending down from 3000-800. MRCP done and shows chronic cholecystitis.  No choledocholithiasis Discussed with Dr. Lysle Pearl who suggested transfer to [facility] due to advanced age and complicated nature. Discussed with Dr. Morey Hummingbird at [facility] who has graciously accepted the patient in transfer.  *Dementia. PRN Haldol while here in the hospital.  Transfer once bed available.  CONSULTS OBTAINED:  Treatment Team:  Benjamine Sprague, DO Virgel Manifold, MD  DRUG ALLERGIES:        Allergies  Allergen Reactions  . Penicillin G     Has patient had a PCN reaction  causing immediate rash, facial/tongue/throat swelling, SOB or lightheadedness with hypotension: no  Has patient had a PCN reaction causing severe rash involving mucus membranes or skin necrosis: no Has patient had a PCN reaction that required hospitalization no Has patient had a PCN reaction occurring within the last 10 years: no If all of the above answers are "NO", then may proceed with Cephalosporin use.     DISCHARGE MEDICATIONS:        Allergies as of 10/18/2017      Reactions   Penicillin G    Has patient had a PCN reaction causing immediate rash, facial/tongue/throat swelling, SOB or lightheadedness with hypotension: no Has patient had a PCN reaction causing severe rash involving mucus membranes or skin necrosis: no Has patient had a PCN reaction that required hospitalization no Has patient had a PCN reaction occurring within the last 10 years: no If all of the above answers are "NO", then may proceed with Cephalosporin use.      Medication List    You have not been prescribed any medications.     Today   VITAL SIGNS:  Blood pressure (!) 140/55, pulse 91, temperature 97.8 F (36.6 C), resp. rate 16, height 4\' 11"  (1.499 m), weight 58.9 kg, SpO2 94 %.  I/O:   No intake or output data in the 24 hours ending 10/19/17 0957  PHYSICAL EXAMINATION:  Physical Exam  GENERAL:  84 y.o.-year-old patient lying in the bed with no acute distress.  LUNGS: Normal breath sounds bilaterally, no wheezing, rales CARDIOVASCULAR: S1,  S2 normal. No murmurs, rubs, or gallops.  ABDOMEN: Soft, tender, non-distended. Bowel sounds present. No organomegaly or mass.  NEUROLOGIC: Moves all 4 extremities. PSYCHIATRIC: The patient is awake but not talking much SKIN: bruising on arms  DATA REVIEW:   CBC LastLabs     Recent Labs  Lab 10/18/17 0506  WBC 13.6*  HGB 10.9*  HCT 34.3*  PLT 218      Chemistries  LastLabs     Recent Labs  Lab  10/18/17 0506  NA 143  K 4.2  CL 107  CO2 28  GLUCOSE 83  BUN 17  CREATININE 1.22*  CALCIUM 8.3*  AST 502*  ALT 250*  ALKPHOS 287*  BILITOT 1.7*      Cardiac Enzymes LastLabs     Recent Labs  Lab 10/17/17 1442  TROPONINI <0.03      Microbiology Results  No results found for this or any previous visit.  RADIOLOGY:   ImagingResults(Last48hours)  Mr Abdomen Mrcp Wo Contrast  Result Date: 10/17/2017 CLINICAL DATA:  Elevated lipase level EXAM: MRI ABDOMEN WITHOUT CONTRAST  (INCLUDING MRCP) TECHNIQUE: Multiplanar multisequence MR imaging of the abdomen was performed. Heavily T2-weighted images of the biliary and pancreatic ducts were obtained, and three-dimensional MRCP images were rendered by post processing. COMPARISON:  Abdominal ultrasound of 10/17/2017. Abdominal MRI from 10/01/2017. FINDINGS: The patient terminated the exam prior to obtaining all of the series. She refused to continue. Despite efforts by the technologist and patient, motion artifact is present on today's exam and could not be eliminated. This reduces exam sensitivity and specificity. Lower chest: Mild scarring in the right lower lobe. Mild cardiomegaly. Hepatobiliary: Gallbladder wall thickening and pericholecystic fluid tracking adjacent to the anterior margin of the lateral segment left hepatic lobe. This is a roughly similar appearance to prior. The common bile duct measures up to 9 mm in diameter, stable by my measurement, without a well-defined filling defect. Somewhat abrupt tapering in the ampulla. No significant degree of intrahepatic biliary dilatation although there may be some mild periportal edema. The gallstones visible on ultrasound are surprisingly difficult to see on MRI, possibly due to motion. There may be a miniscule gallstone in the gallbladder neck on image 18/8 although this is equivocal. Pancreas: Mild peripancreatic edema. Borderline dilatation of the dorsal pancreatic duct in  the pancreatic head and about 3 mm, similar to prior. No pancreatic pseudocyst or appreciable abscess. Spleen:  Unremarkable Adrenals/Urinary Tract: Adrenal glands normal. Bilateral peripelvic cysts. Stomach/Bowel: Scattered diverticula of the descending colon, more confluent diverticulosis in the sigmoid colon. No appreciable dilated small bowel. Periampullary duodenal diverticulum noted. Vascular/Lymphatic: Aortoiliac atherosclerotic vascular disease. No pathologic adenopathy. Other:  Trace fluid in the right paracolic gutter. Musculoskeletal: Levoconvex scoliosis at the thoracolumbar junction with endplate compressions at T12 and L1 similar to prior. IMPRESSION: 1. Peripancreatic edema favoring acute pancreatitis. No pseudocyst or abscess identified. 2. Similar appearance of mild periportal edema in the liver, with a common bile duct at 9 mm which is within normal limits for age and similar back through prior exams such as 12/14/2010. No definite filling defect in the CBD. 3. There is abnormal continued wall thickening of the gallbladder, with some pericholecystic fluid tracking adjacent to the liver margin. This is not appreciably changed from prior and chronic cholecystitis is a distinct possibility. The gallstone seen on ultrasound are poorly seen by MRI. 4.  Aortic Atherosclerosis (ICD10-I70.0). 5. Sigmoid colon diverticulosis with scattered diverticula of the descending colon. 6. Levoconvex lumbar scoliosis with chronic  endplate compressions at T12 and L1. 7. Despite efforts by the technologist and patient, motion artifact is present on today's exam and could not be eliminated. This reduces exam sensitivity and specificity. 8. The patient terminated the exam prior to exam completion. Electronically Signed   By: Van Clines M.D.   On: 10/17/2017 22:00   US Abdomen Limited Ruq  Result Date: 10/17/2017 CLINICAL DATA:  Vomiting EXAM: ULTRASOUND ABDOMEN LIMITED RIGHT UPPER QUADRANT COMPARISON:   10/01/2017 MRI abdomen and abdominal sonogram. FINDINGS: Gallbladder: Distended gallbladder. Moderate diffuse gallbladder wall thickening. Layering subcentimeter gallstones. Small amount of pericholecystic fluid. Small amount of sludge. No sonographic Murphy's sign. Common bile duct: Diameter: 8 mm Liver: No focal lesion identified. Within normal limits in parenchymal echogenicity. Portal vein is patent on color Doppler imaging with normal direction of blood flow towards the liver. IMPRESSION: 1. Cholelithiasis. Distended gallbladder. Moderate diffuse gallbladder wall thickening. Small amount of pericholecystic fluid. Findings are suspicious for acute cholecystitis, although no sonographic Percell Miller sign was reported. Hepatobiliary scintigraphy study may be considered for further evaluation, as clinically warranted. 2. Chronic dilatation of the common bile duct (8 mm diameter), unchanged from recent MRI and ultrasound studies. 3. Normal liver. Electronically Signed   By: Ilona Sorrel M.D.   On: 10/17/2017 17:05     Follow up with PCP in 1 week.  Management plans discussed with the patient, family and they are in agreement.  CODE STATUS:               Code Status Orders  (From admission, onward)                                    Start     Ordered    10/17/17 1755  Do not attempt resuscitation (DNR)  Continuous    Question Answer Comment  In the event of cardiac or respiratory ARREST Do not call a "code blue"   In the event of cardiac or respiratory ARREST Do not perform Intubation, CPR, defibrillation or ACLS   In the event of cardiac or respiratory ARREST Use medication by any route, position, wound care, and other measures to relive pain and suffering. May use oxygen, suction and manual treatment of airway obstruction as needed for comfort.   Comments nurse may pronounce      10/17/17 1755                             Code Status History    Date Active Date Inactive  Code Status Order ID Comments User Context   10/02/2017 0104 10/04/2017 1501 DNR [id number]  Lance Coon, MD Inpatient   11/25/2016 1243 11/26/2016 1612 DNR [id number]  Idelle Crouch, MD Inpatient   11/29/2015 2210 12/01/2015 1706 Full Code [id number]  Gladstone Lighter, MD Inpatient   08/30/2014 1814 08/31/2014 2011 DNR [id number]  Idelle Crouch, MD Inpatient          Advance Directive Documentation     Most Recent Value  Type of Advance Directive  Healthcare Power of Attorney, Living will  Pre-existing out of facility DNR order (yellow form or pink MOST form)  -  "MOST" Form in Place?  -      TOTAL TIME TAKING CARE OF THIS PATIENT ON DAY OF DISCHARGE: more than 30 minutes.   Neita Carp M.D on 10/19/2017 at 9:57 AM  Between 7am to 6pm - Pager - [phone]  After 6pm go to [email or web address] - password [email or web address] [place] [facility]  Office  [phone]  CC: Primary care physician; Baxter Hire, MD  Note: This dictation was prepared with Dragon dictation along with smaller phrase technology. Any transcriptional errors that result from this process are unintentional.

## 2017-10-19 NOTE — Progress Notes (Signed)
EMS here to pick up patient. Both sons [name] and Hollice Espy given number and address to [facility], the receiving nurse's name and number to the nurse's station.

## 2017-10-20 LAB — ANTI-SMOOTH MUSCLE ANTIBODY, IGG: F-Actin IgG: 6 Units (ref 0–19)

## 2017-10-20 LAB — MITOCHONDRIAL ANTIBODIES: Mitochondrial M2 Ab, IgG: <20 Units (ref 0.0–20.0)

## 2017-10-22 DIAGNOSIS — R410 Disorientation, unspecified: Secondary | ICD-10-CM | POA: Diagnosis not present

## 2017-10-24 DIAGNOSIS — F0390 Unspecified dementia without behavioral disturbance: Secondary | ICD-10-CM | POA: Diagnosis not present

## 2017-10-24 DIAGNOSIS — H3530 Unspecified macular degeneration: Secondary | ICD-10-CM | POA: Diagnosis not present

## 2017-10-24 DIAGNOSIS — H9190 Unspecified hearing loss, unspecified ear: Secondary | ICD-10-CM | POA: Diagnosis not present

## 2017-10-24 DIAGNOSIS — M6281 Muscle weakness (generalized): Secondary | ICD-10-CM | POA: Diagnosis not present

## 2017-10-24 DIAGNOSIS — K851 Biliary acute pancreatitis without necrosis or infection: Secondary | ICD-10-CM | POA: Diagnosis not present

## 2017-10-24 DIAGNOSIS — R410 Disorientation, unspecified: Secondary | ICD-10-CM | POA: Diagnosis not present

## 2017-10-24 DIAGNOSIS — J449 Chronic obstructive pulmonary disease, unspecified: Secondary | ICD-10-CM | POA: Diagnosis not present

## 2017-10-24 DIAGNOSIS — R945 Abnormal results of liver function studies: Secondary | ICD-10-CM | POA: Diagnosis not present

## 2017-10-24 DIAGNOSIS — K8011 Calculus of gallbladder with chronic cholecystitis with obstruction: Secondary | ICD-10-CM | POA: Diagnosis not present

## 2017-10-25 DIAGNOSIS — G309 Alzheimer's disease, unspecified: Secondary | ICD-10-CM | POA: Diagnosis not present

## 2017-10-25 DIAGNOSIS — R1312 Dysphagia, oropharyngeal phase: Secondary | ICD-10-CM | POA: Diagnosis not present

## 2017-10-25 DIAGNOSIS — M6281 Muscle weakness (generalized): Secondary | ICD-10-CM | POA: Diagnosis not present

## 2017-10-25 DIAGNOSIS — I2510 Atherosclerotic heart disease of native coronary artery without angina pectoris: Secondary | ICD-10-CM | POA: Diagnosis not present

## 2017-10-25 DIAGNOSIS — Z8679 Personal history of other diseases of the circulatory system: Secondary | ICD-10-CM | POA: Diagnosis not present

## 2017-10-25 DIAGNOSIS — F0390 Unspecified dementia without behavioral disturbance: Secondary | ICD-10-CM | POA: Diagnosis not present

## 2017-10-25 DIAGNOSIS — G4700 Insomnia, unspecified: Secondary | ICD-10-CM | POA: Diagnosis not present

## 2017-10-25 DIAGNOSIS — E538 Deficiency of other specified B group vitamins: Secondary | ICD-10-CM | POA: Diagnosis not present

## 2017-10-25 DIAGNOSIS — Z9049 Acquired absence of other specified parts of digestive tract: Secondary | ICD-10-CM | POA: Diagnosis not present

## 2017-10-25 DIAGNOSIS — G301 Alzheimer's disease with late onset: Secondary | ICD-10-CM | POA: Diagnosis not present

## 2017-10-25 DIAGNOSIS — F29 Unspecified psychosis not due to a substance or known physiological condition: Secondary | ICD-10-CM | POA: Diagnosis not present

## 2017-10-25 DIAGNOSIS — K5900 Constipation, unspecified: Secondary | ICD-10-CM | POA: Diagnosis not present

## 2017-10-25 DIAGNOSIS — R41841 Cognitive communication deficit: Secondary | ICD-10-CM | POA: Diagnosis not present

## 2017-10-25 DIAGNOSIS — K851 Biliary acute pancreatitis without necrosis or infection: Secondary | ICD-10-CM | POA: Diagnosis not present

## 2017-10-25 DIAGNOSIS — K8020 Calculus of gallbladder without cholecystitis without obstruction: Secondary | ICD-10-CM | POA: Diagnosis not present

## 2017-10-25 DIAGNOSIS — K811 Chronic cholecystitis: Secondary | ICD-10-CM | POA: Diagnosis not present

## 2017-10-25 DIAGNOSIS — E569 Vitamin deficiency, unspecified: Secondary | ICD-10-CM | POA: Diagnosis not present

## 2017-10-25 DIAGNOSIS — R110 Nausea: Secondary | ICD-10-CM | POA: Diagnosis not present

## 2017-10-28 DIAGNOSIS — E538 Deficiency of other specified B group vitamins: Secondary | ICD-10-CM | POA: Diagnosis not present

## 2017-10-28 DIAGNOSIS — F0390 Unspecified dementia without behavioral disturbance: Secondary | ICD-10-CM | POA: Diagnosis not present

## 2017-10-28 DIAGNOSIS — K851 Biliary acute pancreatitis without necrosis or infection: Secondary | ICD-10-CM | POA: Diagnosis not present

## 2017-10-28 DIAGNOSIS — Z8679 Personal history of other diseases of the circulatory system: Secondary | ICD-10-CM | POA: Diagnosis not present

## 2017-10-30 ENCOUNTER — Other Ambulatory Visit: Payer: Self-pay | Admitting: *Deleted
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
## 2017-11-01 DIAGNOSIS — F0390 Unspecified dementia without behavioral disturbance: Secondary | ICD-10-CM | POA: Diagnosis not present

## 2017-11-01 DIAGNOSIS — Z8679 Personal history of other diseases of the circulatory system: Secondary | ICD-10-CM | POA: Diagnosis not present

## 2017-11-01 DIAGNOSIS — K851 Biliary acute pancreatitis without necrosis or infection: Secondary | ICD-10-CM | POA: Diagnosis not present

## 2017-11-01 DIAGNOSIS — E538 Deficiency of other specified B group vitamins: Secondary | ICD-10-CM | POA: Diagnosis not present

## 2017-11-07 ENCOUNTER — Other Ambulatory Visit: Payer: Self-pay | Admitting: *Deleted

## 2017-11-07 NOTE — Patient Outreach (Signed)
[place] [facility]) Care Management  11/07/2017  Brandi Nelson July 07, 1907 [id number]   Attended interdisciplinary team discharge meeting at [facility] with [name] team member Marlowe Kays. PT states patient continues to make some progress. Made SW Janett Billow aware patient's son stated he had talked with someone at [facility] about a Medicaid application for his mom but had not heard anything back from it.  She planned to assist him with this.  Went by the room to see patient and son was in the hallway.  Marlowe Kays explained to son "covered days" letter.  Made son aware to see Janett Billow about patient's medicaid application.  Let son know plan to see patient next week during facility visit to assess for further CM needs.  [name] RN, BSN [place] Acute Care Coordinator [phone]) Business Mobile [phone]) Toll free office

## 2017-11-12 DIAGNOSIS — Z9049 Acquired absence of other specified parts of digestive tract: Secondary | ICD-10-CM | POA: Diagnosis not present

## 2017-11-13 ENCOUNTER — Other Ambulatory Visit: Payer: Self-pay | Admitting: *Deleted

## 2017-11-13 DIAGNOSIS — K851 Biliary acute pancreatitis without necrosis or infection: Secondary | ICD-10-CM | POA: Diagnosis not present

## 2017-11-13 DIAGNOSIS — E538 Deficiency of other specified B group vitamins: Secondary | ICD-10-CM | POA: Diagnosis not present

## 2017-11-13 DIAGNOSIS — Z8679 Personal history of other diseases of the circulatory system: Secondary | ICD-10-CM | POA: Diagnosis not present

## 2017-11-13 DIAGNOSIS — F0390 Unspecified dementia without behavioral disturbance: Secondary | ICD-10-CM | POA: Diagnosis not present

## 2017-11-13 NOTE — Patient Outreach (Signed)
[place] [facility]) Care Management  11/13/2017  Brandi Nelson 02-Mar-1907 [id number]   Attended interdisciplinary meeting discharge meeting at [facility].  SW reports patient set to discharge 11/14/17 back to home alone with a nighttime care giver and her two sons checking in during the day providing meals.  SW reports the main Market researcher is patient's son Brandi Nelson.  Went by patient's room, son not available.  Patient noted to be very HOH, but pleasant to speak with. Patient noted to be confused at times during conversation. Patient complained of having a cough.   Patient gave verbal permission to speak with her son Brandi Nelson about her care.   Placed a phone call to son Brandi Nelson.  Brandi Nelson stated patient was set to discharge at [place] on 11/14/17 back to her home.  Son states he and his brother provide breakfast and lunch for his mom and a caregiver comes in from 7pm to [place].  Son states patient was able to navigate her own home prior to surgery but is concerned she will not be able to do this now.  Son requesting assistance with the process of applying for PCS through Medicaid and the medicaid process.  Explained [place] [facility] to son and he accepted on behalf of his mom.   Referral sent for [place] and [facility] LCSW.  Plan to inform [facility] Team member of referral.   [name] RN, [place] Acute Care Coordinator [phone]) Business Mobile [phone]) Toll free office

## 2017-11-14 ENCOUNTER — Other Ambulatory Visit
Admission: RE | Admit: 2017-11-14 | Discharge: 2017-11-14 | Disposition: A | Discharge status: Home / Self Care | Payer: PPO | Source: Ambulatory Visit | Attending: Family Medicine | Admitting: Family Medicine

## 2017-11-14 DIAGNOSIS — E569 Vitamin deficiency, unspecified: Secondary | ICD-10-CM | POA: Insufficient documentation

## 2017-11-14 DIAGNOSIS — K811 Chronic cholecystitis: Secondary | ICD-10-CM | POA: Insufficient documentation

## 2017-11-14 DIAGNOSIS — R110 Nausea: Secondary | ICD-10-CM | POA: Insufficient documentation

## 2017-11-14 LAB — COMPREHENSIVE METABOLIC PANEL
ALK PHOS: 67 U/L (ref 38–126)
ALT: <5 U/L (ref 0–44)
AST: 24 U/L (ref 15–41)
Albumin: 3.3 g/dL — ABNORMAL LOW (ref 3.5–5.0)
Anion gap: 7 (ref 5–15)
BUN: 15 mg/dL (ref 8–23)
CALCIUM: 8.4 mg/dL — AB (ref 8.9–10.3)
CHLORIDE: 105 mmol/L (ref 98–111)
CO2: 27 mmol/L (ref 22–32)
CREATININE: 0.88 mg/dL (ref 0.44–1.00)
GFR calc Af Amer: 57 mL/min — ABNORMAL LOW (ref >60)
GFR, EST NON AFRICAN AMERICAN: 50 mL/min — AB (ref >60)
Glucose, Bld: 65 mg/dL — ABNORMAL LOW (ref 70–99)
Potassium: 4.9 mmol/L (ref 3.5–5.1)
Sodium: 139 mmol/L (ref 135–145)
Total Bilirubin: 0.3 mg/dL (ref 0.3–1.2)
Total Protein: 5.8 g/dL — ABNORMAL LOW (ref 6.5–8.1)

## 2017-11-14 LAB — CBC WITH DIFFERENTIAL/PLATELET
ABS IMMATURE GRANULOCYTES: 0.02 10*3/uL (ref 0.00–0.07)
Basophils Absolute: 0 10*3/uL (ref 0.0–0.1)
Basophils Relative: 0 %
Eosinophils Absolute: 0.2 10*3/uL (ref 0.0–0.5)
Eosinophils Relative: 4 %
HEMATOCRIT: 35.9 % — AB (ref 36.0–46.0)
Hemoglobin: 11.5 g/dL — ABNORMAL LOW (ref 12.0–15.0)
Immature Granulocytes: 1 %
LYMPHS PCT: 35 %
Lymphs Abs: 1.5 10*3/uL (ref 0.7–4.0)
MCH: 29.8 pg (ref 26.0–34.0)
MCHC: 32 g/dL (ref 30.0–36.0)
MCV: 93 fL (ref 80.0–100.0)
MONO ABS: 0.5 10*3/uL (ref 0.1–1.0)
MONOS PCT: 13 %
Neutro Abs: 2 10*3/uL (ref 1.7–7.7)
Neutrophils Relative %: 47 %
Platelets: 161 10*3/uL (ref 150–400)
RBC: 3.86 MIL/uL — ABNORMAL LOW (ref 3.87–5.11)
RDW: 14.6 % (ref 11.5–15.5)
WBC: 4.1 10*3/uL (ref 4.0–10.5)
nRBC: 0 % (ref 0.0–0.2)

## 2017-11-14 LAB — VITAMIN B12: Vitamin B-12: 225 pg/mL (ref 180–914)

## 2017-11-15 ENCOUNTER — Other Ambulatory Visit: Payer: Self-pay | Admitting: *Deleted

## 2017-11-15 ENCOUNTER — Encounter: Payer: Self-pay | Admitting: *Deleted
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
## 2017-11-15 NOTE — Patient Outreach (Signed)
[facility]) Care Management  11/15/2017  Brandi Nelson 11/02/1907 [id number]   Phone call to patient's son to discuss longt term care options. Per patient's son he is patient's HCPOA and pays for patient's in home cre at night, food and any additional care necessities. Per patient's son, he will be stopping this by the first of the year and plans to transition patient to a nursing home. Per patient' son, he plans to go visit [facility] tomorrow and is willing to pay out of pocket if needed. Patient's son would like patient to be placed before he applies for Medicaid, however seems to be confused regarding the placement process. Patient's son seemed easily agitated when this social worker mad attempts to discuss out of home placement process.   Plan: Patient's son [name] and his sister plan to tour [employer]. This Education officer, museum will follow up with patient's son within 1 week regarding placement status and any further assistance needed.   [name] [facility] Care Management [phone]

## 2017-11-20 ENCOUNTER — Encounter: Payer: Self-pay | Admitting: *Deleted

## 2017-11-20 ENCOUNTER — Other Ambulatory Visit: Payer: Self-pay | Admitting: *Deleted

## 2017-11-20 NOTE — Telephone Encounter (Signed)
This encounter was created in error - please disregard.

## 2017-11-20 NOTE — Patient Outreach (Signed)
[name] Care Management  11/20/2017  Brandi Nelson [birth year] [id number]   Phone call to patient's son to follow up on plan for facility care placement and to assess for any assistance needs. Per patient's son, he was curently touring [facility], and although he loves it they havce decided to keep patient at home for right now.  Per patient's son., they have hired another aid to care for patient during the day in addition to the aid at night. Per patient's son, he will apply for medicaid for patient at some point but not right now. This social worker encouraged  patient's son to call this social worker if he needs assistance in the future with placement options for patient. This social worker will inform RNCM Reginia Naas that I will be signing off at this time.   [name] [facility] Care Management [phone]

## 2017-11-21 ENCOUNTER — Encounter: Payer: Self-pay | Admitting: *Deleted

## 2017-11-21 ENCOUNTER — Other Ambulatory Visit: Payer: Self-pay | Admitting: *Deleted

## 2017-11-21 DIAGNOSIS — J449 Chronic obstructive pulmonary disease, unspecified: Secondary | ICD-10-CM | POA: Diagnosis not present

## 2017-11-21 DIAGNOSIS — K851 Biliary acute pancreatitis without necrosis or infection: Secondary | ICD-10-CM | POA: Diagnosis not present

## 2017-11-21 DIAGNOSIS — Z9010 Acquired absence of unspecified breast and nipple: Secondary | ICD-10-CM | POA: Diagnosis not present

## 2017-11-21 DIAGNOSIS — M6281 Muscle weakness (generalized): Secondary | ICD-10-CM | POA: Diagnosis not present

## 2017-11-21 DIAGNOSIS — H3530 Unspecified macular degeneration: Secondary | ICD-10-CM | POA: Diagnosis not present

## 2017-11-21 DIAGNOSIS — H9190 Unspecified hearing loss, unspecified ear: Secondary | ICD-10-CM | POA: Diagnosis not present

## 2017-11-21 DIAGNOSIS — R945 Abnormal results of liver function studies: Secondary | ICD-10-CM | POA: Diagnosis not present

## 2017-11-21 DIAGNOSIS — F0390 Unspecified dementia without behavioral disturbance: Secondary | ICD-10-CM | POA: Diagnosis not present

## 2017-11-21 DIAGNOSIS — K8011 Calculus of gallbladder with chronic cholecystitis with obstruction: Secondary | ICD-10-CM | POA: Diagnosis not present

## 2017-11-21 DIAGNOSIS — Z853 Personal history of malignant neoplasm of breast: Secondary | ICD-10-CM | POA: Diagnosis not present

## 2017-11-21 NOTE — Patient Outreach (Signed)
[facility]) Care Management [place], Transition of Care day 7 Post- SNF discharge day # 7  11/21/2017  Brandi Nelson [birth year] [id number]  Successful telephone outreach to Brandi Nelson, son/ caregiver [phone]) whom patient provided verbal permission to speak with during [facility] RN [facility] outreach on 11/15/17; [place] CM referral received today for transition of care after patient experienced recent hospitalization October 9-11, 2019 for nausea/ vomiting/ pancreatitis; patient was transferred to [facility] for lap chole, which was completed October 19, 2017.  Patient was discharged from hospital to SNF for rehabilitation at [facility], and was subsequently discharged home from [facility] on November 14, 2017 with home health services in place through [place].  Patient has history including, but not limited to, advanced age with dementia; HOH/ blind; COPD.  HIPAA/ identity verified with patient's caregiver today.  Today, caregiver reports that patient "seems to be doing okay," and he denies concerns around patient's condition, that patient is in pain, or has had new/ recent falls post- SNF discharge.  Reports that he is on his way over to visit with patient to take her to lunch.    Caregiver further reports:  Medications: -- unable to complete accurate medication review today, as caregiver is again not near patient's prescriptions to discuss dosing; stated that patient's private duty caregiver provides patient's medications; discussed with caregiver need to/ value of reviewing patient's medications promptly post- SNF discharge; caregiver stated that prior to recent hospitalzation , patient was not on any medications.  Stated that he would confer with private duty caregiver at patient's home to obtain list of any medications patient is taking post- SNF discharge.  Caregiver states that during last PCP office visit in October 2019, PCP told he and  patient that patient did not need to to be on any "regular" daily medications.  Discussed with caregiver that we would review medications next week during phone call, and he is agreeable to this  Home health [facility]) services: -- [place] services for PT, OT, RN, MSW, bath aide continue through [facility])  -- confirms that [facility] services have begun; expecting [facility] team to "visit patient later today"  Provider appointments: -- Upcoming provider appointments were reviewed with caregiver last week; PCP appointment had been scheduled for tomorrow 11/22/17, per review of EMR; caregiver reports today that PCP office called to confirm appointment, which was actually scheduled for this afternoon- caregiver reported that this appointment was cancelled due to frigid temperatures, as it "isn't safe or smart to take patient out."  Caregiver reports that PCP agreed with this appointment cancellation.  Reports plan to maintain contact with PCP by phone around patient's needs, however, he stated that as long as it is cold outside, he does not feel it is safe to get patient out in the weather.  Encouraged caregiver to promptly notify PCP for any new concerns/ issues/ problems that develop, and he agrees to do so.  Social/ [employer] needs: -- confirmed that caregiver was able to speak with [facility] CSW around possibility of long-term placement options; caregiver stated that for now he and patient's other family members have "decided to hold off," as patient now has full time night-time private duty caregiver from 7 pm- 7 am, and they have added another private duty caregiver for afternoons/ evenings; reports family is currently visiting patient and staying with her each morning, in between private duty caregiver schedules. -- reports that caregiver continues to explore options around obtaining  medicaid for patient and is maintaining communication with DSS/ Medicaid staff members -- family provides transportation for  patient to all provider appointments, errands, etc- states family members regularly take patient out for meals when the weather is not cold  Advanced Directive Planning: -- reports patient has living will, HCPOA, and DNR form at home; reports that he is not sure where patient's DNR form at home is- states that it is not posted in full view inside home; discussed need for caregiver to locate this form and to place it where it can be easily seen, should EMS need to be called; we discussed process to obtain new MOST form through PCP office if caregiver is unable to locate her current copy; caregiver agrees to follow up on this, this week.    Caregiver denies further issues, concerns, or problems today. I provided/ confirmed that patient hasmy direct phone number, the main [facility] CM office phone number, and the [facility] CM 24-hour nurse advice phone number should issues arise prior to next scheduled [place] outreach by phone next week.  Encouraged patient's caregiver to contact me directly if needs, questions, issues, or concerns arise prior to next scheduled outreach; he agreed to do so.  Plan:  Patient will take medications as prescribed and will attend all scheduled provider appointments  Patient/ caregiver will promptly notify care providers for any new concerns/ issues/ problems that arise  Patient will actively participate in home health services as ordered post-hospital discharge  [place] outreach for transition of care to continue with scheduled phone call next week  [facility] CM Care Plan Problem One     Most Recent Value  Care Plan Problem One  High risk for hospital readmission related to/ as evidenced by recent hospitalization for pancreatitis with lap chole and subsequent SNF rehabilitation visit  Role Documenting the Problem One  Care Management Coordinator  Care Plan for Problem One  Active  THN Long Term Goal   Over the next 31 days, patient will not experience hospital  readmission, as evidenced by review of EMR and patient and caregiver reporting during [place] outreach  [place] Term Goal Start Date  11/15/17  Interventions for Problem One Long Term Goal  Discussed current clinical condition with patient's son/ caregiver and confirmed that he has no clinical concerns around patient's condition post-hospital discharge  THN CM Short Term Goal #1   Over the next 30 days, patient will actively participate in home health services as ordered post-hospital discharge, as evidenced by caregiver/ patient reporting and collaboration with home health team as indicated during [place] outreach  [facility] CM Short Term Goal #1 Start Date  11/15/17  Interventions for Short Term Goal #1  Confirmed with patient's caregiver/ son that patient continues to be active with home health services  THN CM Short Term Goal #2   Over the next 30 days, patient's caregiver will establish/ maintain communication with [employer] CSW aorund patient's need for personal care services and medicaid, as evidenced by caregiver reporitng and collaboration with [facility] CSW during [facility] RN CCM outreach  [facility] CM Short Term Goal #2 Start Date  11/15/17  [facility] CM Short Term Goal #2 Met Date  11/21/17 [Goal met]  Interventions for Short Term Goal #2  Confirmed with patient's son/ caregiver that he has spoken with [facility] CSW,  confirmed that he and is family have decided to keep patient at home for now with constant supervision through private duty caregivers  Oneta Rack, RN, BSN, [employer] [facility] Care Management  [phone]

## 2017-11-27 ENCOUNTER — Encounter: Payer: Self-pay | Admitting: *Deleted

## 2017-11-27 ENCOUNTER — Other Ambulatory Visit: Payer: Self-pay | Admitting: *Deleted

## 2017-11-27 DIAGNOSIS — R945 Abnormal results of liver function studies: Secondary | ICD-10-CM | POA: Diagnosis not present

## 2017-11-27 DIAGNOSIS — H9190 Unspecified hearing loss, unspecified ear: Secondary | ICD-10-CM | POA: Diagnosis not present

## 2017-11-27 DIAGNOSIS — K8011 Calculus of gallbladder with chronic cholecystitis with obstruction: Secondary | ICD-10-CM | POA: Diagnosis not present

## 2017-11-27 DIAGNOSIS — K851 Biliary acute pancreatitis without necrosis or infection: Secondary | ICD-10-CM | POA: Diagnosis not present

## 2017-11-27 DIAGNOSIS — F0390 Unspecified dementia without behavioral disturbance: Secondary | ICD-10-CM | POA: Diagnosis not present

## 2017-11-27 DIAGNOSIS — J449 Chronic obstructive pulmonary disease, unspecified: Secondary | ICD-10-CM | POA: Diagnosis not present

## 2017-11-27 DIAGNOSIS — H3530 Unspecified macular degeneration: Secondary | ICD-10-CM | POA: Diagnosis not present

## 2017-11-27 DIAGNOSIS — Z9010 Acquired absence of unspecified breast and nipple: Secondary | ICD-10-CM | POA: Diagnosis not present

## 2017-11-27 DIAGNOSIS — M6281 Muscle weakness (generalized): Secondary | ICD-10-CM | POA: Diagnosis not present

## 2017-11-27 DIAGNOSIS — Z853 Personal history of malignant neoplasm of breast: Secondary | ICD-10-CM | POA: Diagnosis not present

## 2017-11-27 NOTE — Patient Outreach (Signed)
Brandi Nelson [address] [facility]) Care Management THN Community CM Telephone Outreach, Transition of Care day 13 Post- SNF discharge day # 13 11/27/2017  Brandi Nelson March 10, 1907 [id number]  Successful telephone outreach to [place], son/ [phone])whom patient provided verbal permission to speak with during [facility] RN [facility] outreach on 11/15/17; [place] CM referral received today for transition of care after patient experienced recent hospitalization October 9-11, 2019 for nausea/ vomiting/ pancreatitis; patient was transferred to [facility] for lap chole, which was completed October 19, 2017. Patient was discharged from hospital to SNF for rehabilitation at [facility], and was subsequently discharged home from [facility] on November 14, 2017 with home health services in place through [place].Patient has history including, but not limited to, advanced age with dementia; HOH/ blind; COPD.HIPAA/ identity verified with patient's caregiver today.  Today, caregiver reports that patient "seems to be doing fine- about the same as she has been," and he denies concerns around patient's condition, that patient is in pain, or has had new/ recent falls post- SNF discharge. Reports that he is on his way over to see patient. Also reports that he has a scheduled appointment with DSS, "the people at [facility]" next week to obtain information around obtaining medicaid for patient.   Caregiver further reports:  Medications: --again unable to complete accurate medication review today, as caregiver is again not currently near patient's prescriptions to discuss dosing; stated that patient's private duty caregiver continues to provide patient's medications; and adds that he "is fairly certain that she is not on prescription medications;" and only takes OTC melatonin at night to help her sleep.  Stated again that the medications patient was discharged form SNF with were not her regular  medications.  Again discussed need for confirmation around this, and caregiver reported he "would try to remember" to discuss with patient's private duty caregiver.  Home health [facility]) services: -- [place] services for PT, OT, RN, MSW, bath aidecontinue through [facility]) -- confirms that [facility] services continue, states "they are visiting her regularly;"expecting [facility] team to "visit patient today to giver her a bath"  Provider appointments: -- No noted upcoming provider appointments; again stated that he does not feel that it is safe to take patient out in 'fall weather," despite that temperatures have warmed up since our last conversation.  Encouraged caregiver to consider making prompt post- SNF discharge PCP appointment; states he "will think about it."  Encouraged caregiver to promptly notify PCP for any new concerns/ issues/ problems that develop, and he agrees to do so.  Advanced Directive Planning: -- reports that he was able to locate patient's DNR form at home; reports that he placed in full view where patient normally sits at home.  Confirmed that there is no expiration date on the MOST form    Caregiverdenies further issues, concerns, or problems today. Iprovided/confirmed that patient hasmy direct phone number, the main [facility] CM office phone number, and the [facility] CM 24-hour nurse advice phone number should issues arise prior to next scheduled [place] outreachby phone next week. Encouraged patient's caregiver to contact me directly if needs, questions, issues, or concerns arise prior to next scheduled outreach; he agreed to do so.  Plan:  Patient will take medications as prescribed and will attend all scheduled provider appointments  Patient/ caregiverwill promptly notify care providers for any new concerns/ issues/ problems that arise  Patient will actively participate in home health services as ordered post-hospital discharge  [place]  outreachfor  transition of careto continue with scheduled phone call next week  I will share today's [place] CM notes/ care plan with patient's PCP as initial assessment  [facility] CM Care Plan Problem One     Most Recent Value  Care Plan Problem One  High risk for hospital readmission related to/ as evidenced by recent hospitalization for pancreatitis with lap chole and subsequent SNF rehabilitation visit  Role Documenting the Problem One  Care Management [place] for Problem One  Active  THN Long Term Goal   Over the next 31 days, patient will not experience hospital readmission, as evidenced by review of EMR and patient and caregiver reporting during [place] outreach  [facility] Long Term Goal Start Date  11/15/17  Interventions for Problem One Long Term Goal  Discussed current clinical conditon with  caregiver, confirmed that he has no concerns around patient's condition.  Confirmed that caregiver continues to have around the clock supervision of patient with private duty caregivers and family.  Again discussed need for prompt PCP office visit and need for confirmation that patient is not taking any prescription medications/ medication review  THN CM Short Term Goal #1   Over the next 30 days, patient will actively participate in home health services as ordered post-hospital discharge, as evidenced by caregiver/ patient reporting and collaboration with home health team as indicated during [place] outreach  [facility] CM Short Term Goal #1 Start Date  11/15/17  Interventions for Short Term Goal #1  Confirmed with caregiver that home health services continue,  discussed with caregiver recent home health visits this week     Oneta Rack, RN, BSN, [place] Care Management  [phone]

## 2017-12-07 ENCOUNTER — Encounter: Payer: Self-pay | Admitting: *Deleted

## 2017-12-07 ENCOUNTER — Other Ambulatory Visit: Payer: Self-pay | Admitting: *Deleted

## 2017-12-07 NOTE — Patient Outreach (Signed)
[facility]) Care Management [facility]N Community CM Telephone Outreach, Transition of Care day 23 Post- SNF discharge day # 23 12/07/2017  JA PISTOLE 04-18-1907 [id number]  Successful telephone outreach to [place], son/ caregiver([phone])whom patient provided verbal permission to speak with during [facility] RN [facility] outreach on 11/15/17; [place] CM referral received for transition of care after patient experienced recent hospitalization October 9-11, 2019 for nausea/ vomiting/ pancreatitis; patient was transferred to [facility] for lap chole, which was completed October 19, 2017. Patient was discharged from hospital to SNF for rehabilitation at [facility], and was subsequently discharged home from [facility] on November 14, 2017 with home health services in place through [place].Patient has history including, but not limited to, advanced age with dementia; HOH/ blind; COPD.HIPAA/ identity verified with patient's caregiver today.  Today, caregiver reports that patient "is doing fine- we took her to my son's house for Thanksgiving and she had a good time," and he denies concerns around patient's condition, that patient is in pain, or has had new/ recent falls post- SNF discharge. Reportsthat patient has a plumbing issue at her home that he is working to correct; states that he may need to bring patient to his home until this issue is resolved. Caregiver also reports that he did attend scheduled appointment with DSS, last week to obtain information around obtaining medicaid for patient; denies need for further assistance in this process, stating that he is working with [employer], and understands that patient's "money has to be spent down" before she will qualify for medicaid.  Caregiver also reports that he has independently gone to 3 different SNF's to inquire about possible placement.  Caregiver denies questions around this process.   Caregiver further  reports:  --patient is not taking any prescription medications; states that she occasionally takes OTC medications, but "that is all."   Patient was recently discharged from SNF and prn OTC medications were reviewed with caregiver today.  -- home health services/ disciplines have all now signed off-- states "they said she was doing so well they don't need to come any more.  Confirms that patient continues having private duty caregivers stay at her home at night and during day time hours as well.  -- scheduled upcoming provider appointment with podiatrist for nail trim; states he continues communicating with patient's PCP for any needs that arise.  Again encouraged caregiver to consider scheduling prompt PCP office visit.  Caregiver continues to verbalize reticence in doing so, stating that it is too hard on patient to get out, and that her PCP is "very available" by phone.  Caregiverdenies further issues, concerns, or problems today. Iprovided/confirmed that patient hasmy direct phone number, the main [facility] CM office phone number, and the [facility] CM 24-hour nurse advice phone number should issues arise prior to next scheduled [place] outreachby phone next week. Encouraged patient's caregiverto contact me directly if needs, questions, issues, or concerns arise prior to next scheduled outreach; heagreed to do so.  Plan:  Patient will take OTC medications as needed and will attend all scheduled provider appointments  Patient/ caregiverwill promptly notify care providers for any new concerns/ issues/ problems that arise  [facility]N Community CM outreachfor transition of careto continue with scheduled phone call next week  [facility] CM Care Plan Problem One     Most Recent Value  Care Plan Problem One  High risk for hospital readmission related to/ as evidenced by recent hospitalization for pancreatitis with lap chole and subsequent SNF  rehabilitation visit  Role Documenting the Problem  One  Care Management [place] for Problem One  Active  THN Long Term Goal   Over the next 31 days, patient will not experience hospital readmission, as evidenced by review of EMR and patient and caregiver reporting during [facility] RN CCM outreach  [place] Term Goal Start Date  11/15/17  Interventions for Problem One Long Term Goal  Discussed current clinical condition with caregiver and confirmed that caregiver does not have concerns with patient's current conditons,  confirmed that patient is not taking any prescription medications at this time  [facility] CM Short Term Goal #1   Over the next 30 days, patient will actively participate in home health services as ordered post-hospital discharge, as evidenced by caregiver/ patient reporting and collaboration with home health team as indicated during [place] outreach  [facility] CM Short Term Goal #1 Start Date  11/15/17  [facility] CM Short Term Goal #1 Met Date  12/07/17  Interventions for Short Term Goal #1  Discussed with caregiver that [facility] services have signed off,  confirmed with caregiver that patient continues having private duty care assistance at her home     Oneta Rack, RN, BSN, [employer] Coordinator [facility] Care Management  [phone]

## 2017-12-11 ENCOUNTER — Other Ambulatory Visit: Payer: Self-pay | Admitting: *Deleted

## 2017-12-11 ENCOUNTER — Encounter: Payer: Self-pay | Admitting: *Deleted

## 2017-12-11 NOTE — Patient Outreach (Signed)
[facility]) Care Management [facility] Community CM Telephone Outreach, Transition of Care day 27 Post- SNF discharge day # 27 Unsuccessful call attempt # 1- engaged patient/ caregiver  12/11/2017  Brandi Nelson 1907-02-08 [id number]  10:35 am: placed multiple call to patient's caregiver RogerOwens, son/ caregiver([phone])whom patient provided verbal permission to speak with during [facility] outreach on 11/15/17; [place] CM referral received for transition of care after patient experienced recent hospitalization October 9-11, 2019 for nausea/ vomiting/ pancreatitis; patient was transferred to [facility] for lap chole, which was completed October 19, 2017. Patient was discharged from hospital to SNF for rehabilitation at [facility], and was subsequently discharged home from [facility] on November 14, 2017 with home health services in place through [place].Patient has history including, but not limited to, advanced age with dementia; HOH/ blind; COPD.  Caregiver's phone numbers were busy with initial call attempts; eventually, I was able to leave HIPAA compliant voice message on caregivers home phone, requesting call back.  Plan:  Will re-attempt [place] telephone outreach within 4 business days if I do not hear back from patient's caregiver first.

## 2017-12-12 DIAGNOSIS — M79674 Pain in right toe(s): Secondary | ICD-10-CM | POA: Diagnosis not present

## 2017-12-12 DIAGNOSIS — M79675 Pain in left toe(s): Secondary | ICD-10-CM | POA: Diagnosis not present

## 2017-12-12 DIAGNOSIS — B351 Tinea unguium: Secondary | ICD-10-CM | POA: Diagnosis not present

## 2017-12-17 ENCOUNTER — Encounter: Payer: Self-pay | Admitting: *Deleted

## 2017-12-17 ENCOUNTER — Other Ambulatory Visit: Payer: Self-pay | Admitting: *Deleted

## 2017-12-17 NOTE — Patient Outreach (Signed)
[facility]) Care Management [facility] CM Telephone Outreach, Transition of Care day [place] Case Closure 12/17/2017  QAMAR ROSMAN Jan 07, 1908 [id number]  09:30/ 09:35 am:  Unsuccessful telephone outreach  X 2 to RogerOwens, son/ caregiver([phone])whom patient provided verbal permission to speak with during [facility] outreach on 11/15/17; [place] CM referral received for transition of care after patient experienced recent hospitalization October 9-11, 2019 for nausea/ vomiting/ pancreatitis; patient was transferred to [facility] for lap chole, which was completed October 19, 2017. Patient was discharged from hospital to SNF for rehabilitation at [facility], and was subsequently discharged home from [facility] on November 14, 2017 with home health services in place through [place].Patient has history including, but not limited to, advanced age with dementia; HOH/ blind; COPD.  Attempted calls to both phone numbers listed for patient's caregiver; left HIPAA compliant voice message on caregiver's listed home phone; on cell phone received automated outgoing voice message stating that the number was unavailable for calls, and was unable to leave voice message on that number.  11:10 am: caregiver returned my call; HIPAA/ identity verified with patient's caregiver today.  Today, caregiver reports that patient "is doingabout the same, no problems or concerns."  Denies concerns around patient's condition, that patient is in pain, or has had new/ recent falls post- SNF discharge. Reports previously reported plumbing issue at patient's home "has been resolved;" states patient continues living alone in her home with private duty caregivers and family being with patient "at all times."  Caregiver further reports:  -- continues having private duty caregivers active in patient's care; states that he is looking for a new caregiver to replace another that will be  retiring soon; declines need for assistance with this; offered [facility] CSW referral-- caregiver stated that he understands and has looked into private duty agencies and that he wishes to obtain someone "on his own;" states that he plans to arrange in-home care himself, by finding someone in the community, as he has with her other caregivers  -- continues working with DSS/ Medicaid staff to explore patient's ongoing eligibility for medicaid  -- patient has had no recent changes or additions to medications-- continues to report that patient is not taking any medications.  -- continues keeping close contact with patient's PCP for any new issues, problems, or concerns-- again, caregiver denies recent concerns around patient's overall condition/ plan of care.  Caregiverdenies further issues, concerns, or problems today. We discussed that patient has met previously established care goals, and caregiver denies further care coordination needs at this time.  We discussed [facility] Community CM case closure, and caregiver agrees with case closure today.  Iconfirmed that caregiver hasmy direct phone number, the main [facility] CM office phone number, and the [facility] CM 24-hour nurse advice phone number should issues arise in the future.  Plan:  Will close [place] patient program, as patient has successfully met her previously established goals and caregiver denies further care coordination needs; will make patient's PCP aware of same.  [facility] CM Care Plan Problem One     Most Recent Value  Care Plan Problem One  High risk for hospital readmission related to/ as evidenced by recent hospitalization for pancreatitis with lap chole and subsequent SNF rehabilitation visit  Role Documenting the Problem One  Care Management [place] for Problem One  Not Active  [facility] Long Term Goal   Over the next 31 days, patient will not experience  hospital readmission, as evidenced by review of EMR and patient and caregiver  reporting during [facility] RN CCM outreach  [place] Term Goal Start Date  11/15/17  [facility] Long Term Goal Met Date  12/17/17 [facility] Met]  Interventions for Problem One Long Term Goal  Confirmed with patient's caregiver that patient has not experienced hospital re-admission within last 31 days of discharge,  discussed patient's current clinical condition and confirmed that caregiver has no further care coordination needs     It has been a pleasure caring for Ms. Janyth Contes Jamse Arn, RN, BSN, [employer] Coordinator [facility] Care Management  [phone]

## 2017-12-19 DIAGNOSIS — H6123 Impacted cerumen, bilateral: Secondary | ICD-10-CM | POA: Diagnosis not present

## 2017-12-19 DIAGNOSIS — H903 Sensorineural hearing loss, bilateral: Secondary | ICD-10-CM | POA: Diagnosis not present

## 2018-01-10 DIAGNOSIS — G301 Alzheimer's disease with late onset: Secondary | ICD-10-CM | POA: Diagnosis not present

## 2018-01-10 DIAGNOSIS — F0280 Dementia in other diseases classified elsewhere without behavioral disturbance: Secondary | ICD-10-CM | POA: Diagnosis not present

## 2018-06-26 DIAGNOSIS — F0280 Dementia in other diseases classified elsewhere without behavioral disturbance: Secondary | ICD-10-CM | POA: Diagnosis not present

## 2018-06-26 DIAGNOSIS — L89510 Pressure ulcer of right ankle, unstageable: Secondary | ICD-10-CM | POA: Diagnosis not present

## 2018-06-26 DIAGNOSIS — G301 Alzheimer's disease with late onset: Secondary | ICD-10-CM | POA: Diagnosis not present

## 2018-07-16 DIAGNOSIS — R29898 Other symptoms and signs involving the musculoskeletal system: Secondary | ICD-10-CM | POA: Diagnosis not present

## 2018-07-16 DIAGNOSIS — L03115 Cellulitis of right lower limb: Secondary | ICD-10-CM | POA: Diagnosis not present

## 2018-07-26 DIAGNOSIS — L03116 Cellulitis of left lower limb: Secondary | ICD-10-CM | POA: Diagnosis not present

## 2018-07-26 DIAGNOSIS — Z9181 History of falling: Secondary | ICD-10-CM | POA: Diagnosis not present

## 2018-07-26 DIAGNOSIS — H25019 Cortical age-related cataract, unspecified eye: Secondary | ICD-10-CM | POA: Diagnosis not present

## 2018-07-26 DIAGNOSIS — I10 Essential (primary) hypertension: Secondary | ICD-10-CM | POA: Diagnosis not present

## 2018-07-26 DIAGNOSIS — Z9010 Acquired absence of unspecified breast and nipple: Secondary | ICD-10-CM | POA: Diagnosis not present

## 2018-07-26 DIAGNOSIS — I6529 Occlusion and stenosis of unspecified carotid artery: Secondary | ICD-10-CM | POA: Diagnosis not present

## 2018-07-26 DIAGNOSIS — J45902 Unspecified asthma with status asthmaticus: Secondary | ICD-10-CM | POA: Diagnosis not present

## 2018-07-26 DIAGNOSIS — Z853 Personal history of malignant neoplasm of breast: Secondary | ICD-10-CM | POA: Diagnosis not present

## 2018-07-26 DIAGNOSIS — I4510 Unspecified right bundle-branch block: Secondary | ICD-10-CM | POA: Diagnosis not present

## 2018-07-26 DIAGNOSIS — H3530 Unspecified macular degeneration: Secondary | ICD-10-CM | POA: Diagnosis not present

## 2018-08-05 DIAGNOSIS — Z9181 History of falling: Secondary | ICD-10-CM | POA: Diagnosis not present

## 2018-08-05 DIAGNOSIS — I6529 Occlusion and stenosis of unspecified carotid artery: Secondary | ICD-10-CM | POA: Diagnosis not present

## 2018-08-05 DIAGNOSIS — I4510 Unspecified right bundle-branch block: Secondary | ICD-10-CM | POA: Diagnosis not present

## 2018-08-05 DIAGNOSIS — L03116 Cellulitis of left lower limb: Secondary | ICD-10-CM | POA: Diagnosis not present

## 2018-08-05 DIAGNOSIS — H3530 Unspecified macular degeneration: Secondary | ICD-10-CM | POA: Diagnosis not present

## 2018-08-05 DIAGNOSIS — I10 Essential (primary) hypertension: Secondary | ICD-10-CM | POA: Diagnosis not present

## 2018-08-05 DIAGNOSIS — H25019 Cortical age-related cataract, unspecified eye: Secondary | ICD-10-CM | POA: Diagnosis not present

## 2018-08-05 DIAGNOSIS — J45902 Unspecified asthma with status asthmaticus: Secondary | ICD-10-CM | POA: Diagnosis not present

## 2018-08-08 IMAGING — CR DG CHEST 2V
2 series · 2 of 2 positions shown · non-contrast
Comparison: 11/29/2015

CLINICAL DATA: Shortness of Breath

EXAM:
CHEST  2 VIEW

[chest lat]
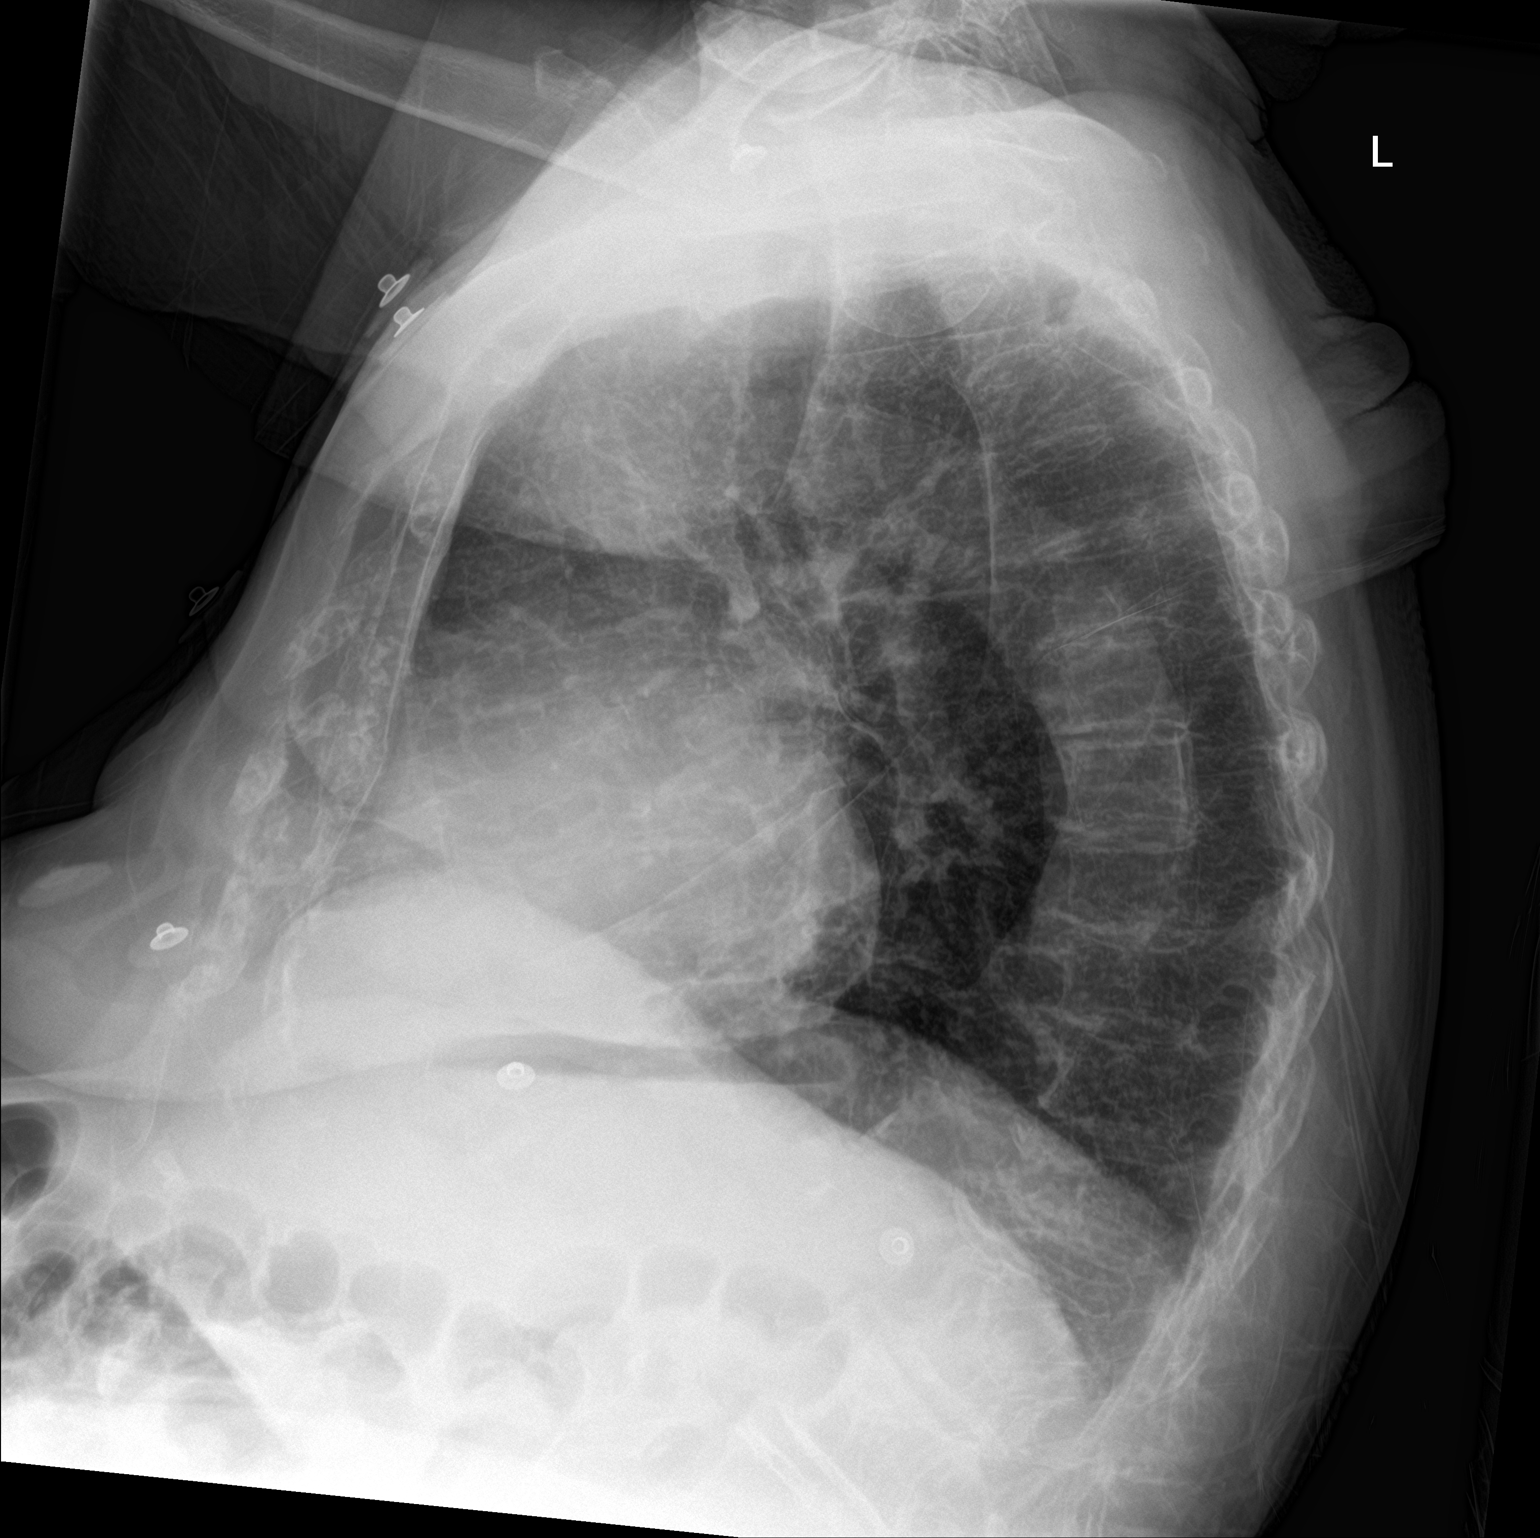

[chest ap]
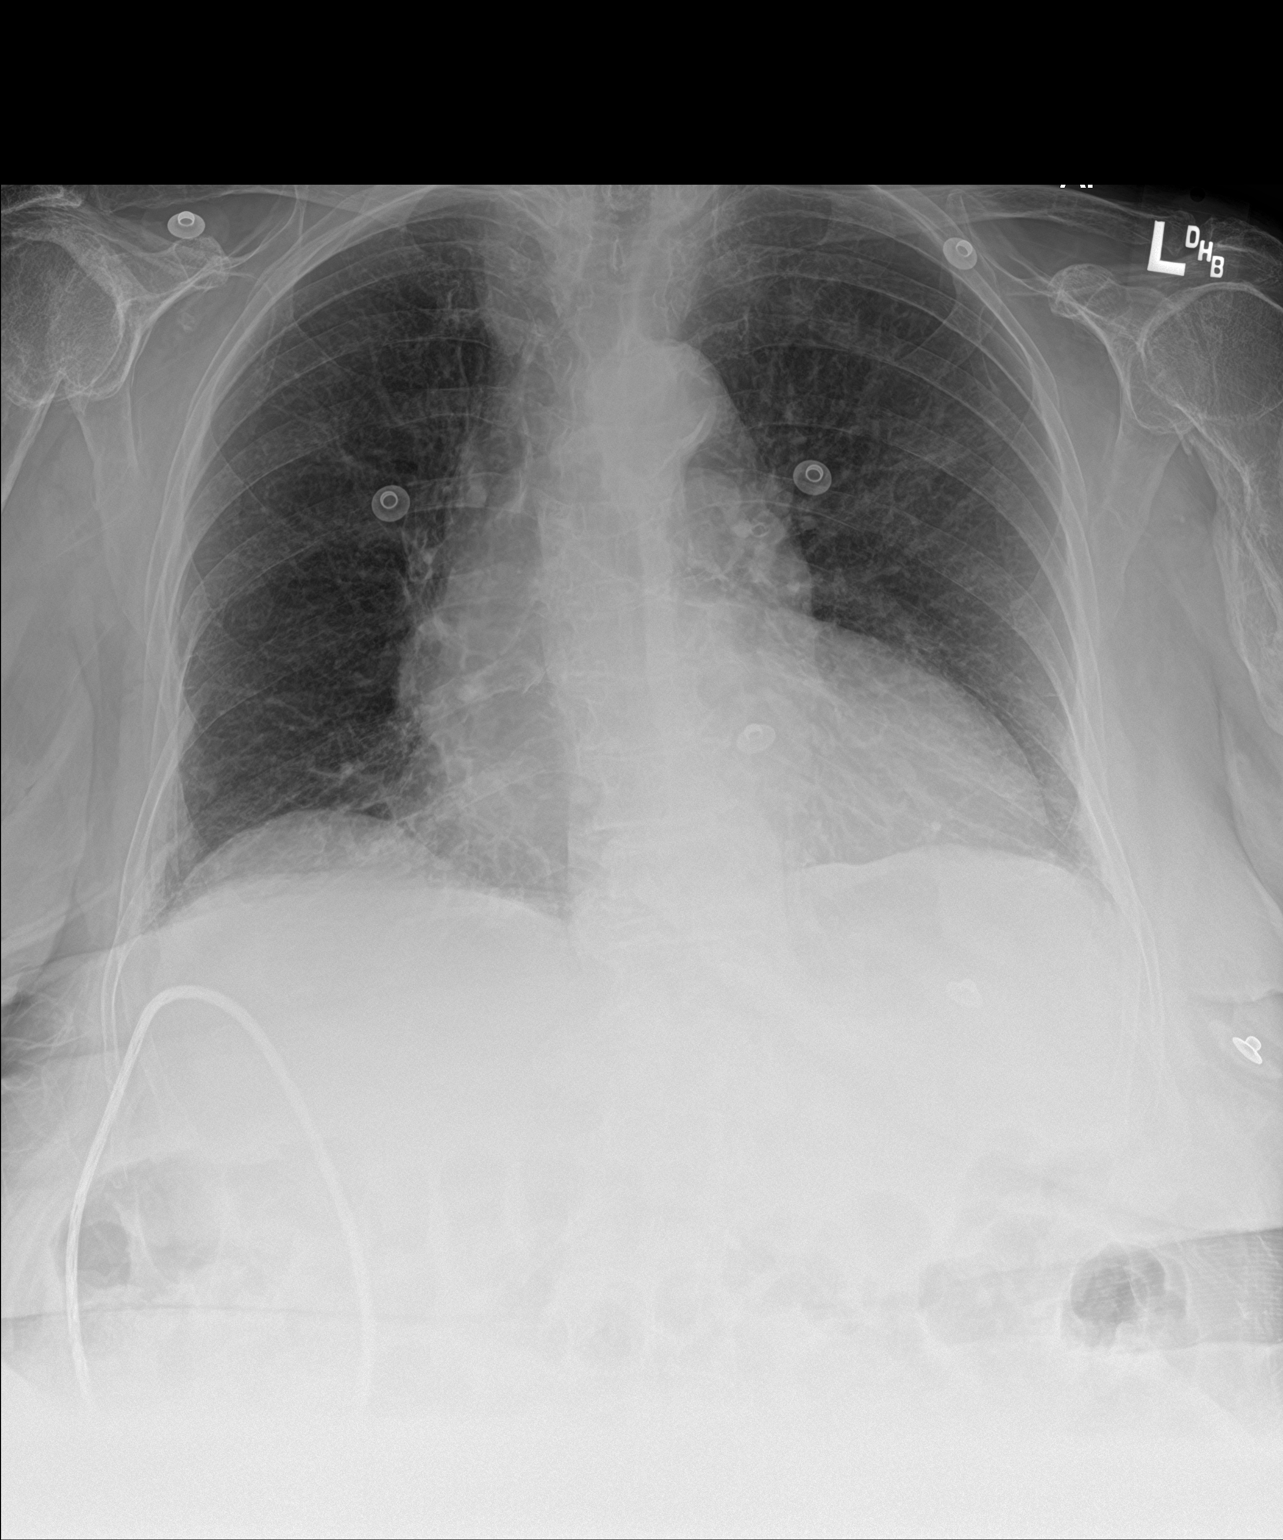

[2 of 2 positions shown; findings below may reference images not displayed]

FINDINGS: Cardiac shadow remains enlarged. Aortic calcifications are again
noted. Lungs are well aerated bilaterally with mild interstitial
changes stable from the prior study. No acute infiltrate or sizable
effusion is noted. Chronic changes in the shoulder joints are noted
bilaterally. Degenerative changes of the thoracic spine are noted.
IMPRESSION: No active cardiopulmonary disease.

## 2018-08-16 DIAGNOSIS — I10 Essential (primary) hypertension: Secondary | ICD-10-CM | POA: Diagnosis not present

## 2018-08-16 DIAGNOSIS — I6529 Occlusion and stenosis of unspecified carotid artery: Secondary | ICD-10-CM | POA: Diagnosis not present

## 2018-08-16 DIAGNOSIS — L03116 Cellulitis of left lower limb: Secondary | ICD-10-CM | POA: Diagnosis not present

## 2018-08-16 DIAGNOSIS — H25019 Cortical age-related cataract, unspecified eye: Secondary | ICD-10-CM | POA: Diagnosis not present

## 2018-08-16 DIAGNOSIS — Z9010 Acquired absence of unspecified breast and nipple: Secondary | ICD-10-CM | POA: Diagnosis not present

## 2018-08-16 DIAGNOSIS — Z9181 History of falling: Secondary | ICD-10-CM | POA: Diagnosis not present

## 2018-08-16 DIAGNOSIS — H3530 Unspecified macular degeneration: Secondary | ICD-10-CM | POA: Diagnosis not present

## 2018-08-16 DIAGNOSIS — Z853 Personal history of malignant neoplasm of breast: Secondary | ICD-10-CM | POA: Diagnosis not present

## 2018-08-16 DIAGNOSIS — J45902 Unspecified asthma with status asthmaticus: Secondary | ICD-10-CM | POA: Diagnosis not present

## 2018-08-16 DIAGNOSIS — I4510 Unspecified right bundle-branch block: Secondary | ICD-10-CM | POA: Diagnosis not present

## 2018-08-28 DIAGNOSIS — Z853 Personal history of malignant neoplasm of breast: Secondary | ICD-10-CM | POA: Diagnosis not present

## 2018-08-28 DIAGNOSIS — Z9181 History of falling: Secondary | ICD-10-CM | POA: Diagnosis not present

## 2018-08-28 DIAGNOSIS — I10 Essential (primary) hypertension: Secondary | ICD-10-CM | POA: Diagnosis not present

## 2018-08-28 DIAGNOSIS — Z9010 Acquired absence of unspecified breast and nipple: Secondary | ICD-10-CM | POA: Diagnosis not present

## 2018-08-28 DIAGNOSIS — J45902 Unspecified asthma with status asthmaticus: Secondary | ICD-10-CM | POA: Diagnosis not present

## 2018-08-28 DIAGNOSIS — L03116 Cellulitis of left lower limb: Secondary | ICD-10-CM | POA: Diagnosis not present

## 2018-08-28 DIAGNOSIS — I4510 Unspecified right bundle-branch block: Secondary | ICD-10-CM | POA: Diagnosis not present

## 2018-08-28 DIAGNOSIS — H3530 Unspecified macular degeneration: Secondary | ICD-10-CM | POA: Diagnosis not present

## 2018-08-28 DIAGNOSIS — I6529 Occlusion and stenosis of unspecified carotid artery: Secondary | ICD-10-CM | POA: Diagnosis not present

## 2018-08-28 DIAGNOSIS — H25019 Cortical age-related cataract, unspecified eye: Secondary | ICD-10-CM | POA: Diagnosis not present

## 2018-09-02 DIAGNOSIS — H903 Sensorineural hearing loss, bilateral: Secondary | ICD-10-CM | POA: Diagnosis not present

## 2018-09-02 DIAGNOSIS — H6123 Impacted cerumen, bilateral: Secondary | ICD-10-CM | POA: Diagnosis not present

## 2018-12-20 ENCOUNTER — Encounter: Payer: Self-pay | Admitting: *Deleted

## 2018-12-20 ENCOUNTER — Emergency Department
Admission: EM | Admit: 2018-12-20 | Discharge: 2018-12-24 | Disposition: A | Discharge status: Skilled Nursing Facility | Attending: Emergency Medicine | Admitting: Emergency Medicine

## 2018-12-20 DIAGNOSIS — U071 COVID-19: Secondary | ICD-10-CM | POA: Insufficient documentation

## 2018-12-20 DIAGNOSIS — F0390 Unspecified dementia without behavioral disturbance: Secondary | ICD-10-CM | POA: Insufficient documentation

## 2018-12-20 DIAGNOSIS — J449 Chronic obstructive pulmonary disease, unspecified: Secondary | ICD-10-CM | POA: Insufficient documentation

## 2018-12-20 DIAGNOSIS — R05 Cough: Secondary | ICD-10-CM | POA: Diagnosis not present

## 2018-12-20 DIAGNOSIS — R531 Weakness: Secondary | ICD-10-CM

## 2018-12-20 DIAGNOSIS — J45909 Unspecified asthma, uncomplicated: Secondary | ICD-10-CM | POA: Insufficient documentation

## 2018-12-20 LAB — CBC WITH DIFFERENTIAL/PLATELET
Abs Immature Granulocytes: 0.02 10*3/uL (ref 0.00–0.07)
Basophils Absolute: 0 10*3/uL (ref 0.0–0.1)
Basophils Relative: 0 %
Eosinophils Absolute: 0.1 10*3/uL (ref 0.0–0.5)
Eosinophils Relative: 1 %
HCT: 35.6 % — ABNORMAL LOW (ref 36.0–46.0)
Hemoglobin: 11.5 g/dL — ABNORMAL LOW (ref 12.0–15.0)
Immature Granulocytes: 1 %
Lymphocytes Relative: 38 %
Lymphs Abs: 1.5 10*3/uL (ref 0.7–4.0)
MCH: 30.3 pg (ref 26.0–34.0)
MCHC: 32.3 g/dL (ref 30.0–36.0)
MCV: 93.7 fL (ref 80.0–100.0)
Monocytes Absolute: 0.5 10*3/uL (ref 0.1–1.0)
Monocytes Relative: 12 %
Neutro Abs: 2 10*3/uL (ref 1.7–7.7)
Neutrophils Relative %: 48 %
Platelets: 145 10*3/uL — ABNORMAL LOW (ref 150–400)
RBC: 3.8 MIL/uL — ABNORMAL LOW (ref 3.87–5.11)
RDW: 13.8 % (ref 11.5–15.5)
WBC: 4.1 10*3/uL (ref 4.0–10.5)
nRBC: 0 % (ref 0.0–0.2)

## 2018-12-20 LAB — COMPREHENSIVE METABOLIC PANEL
ALT: 13 U/L (ref 0–44)
AST: 25 U/L (ref 15–41)
Albumin: 3.8 g/dL (ref 3.5–5.0)
Alkaline Phosphatase: 64 U/L (ref 38–126)
Anion gap: 9 (ref 5–15)
BUN: 28 mg/dL — ABNORMAL HIGH (ref 8–23)
CO2: 23 mmol/L (ref 22–32)
Calcium: 8.3 mg/dL — ABNORMAL LOW (ref 8.9–10.3)
Chloride: 107 mmol/L (ref 98–111)
Creatinine, Ser: 0.91 mg/dL (ref 0.44–1.00)
GFR calc Af Amer: 57 mL/min — ABNORMAL LOW (ref >60)
GFR calc non Af Amer: 49 mL/min — ABNORMAL LOW (ref >60)
Glucose, Bld: 102 mg/dL — ABNORMAL HIGH (ref 70–99)
Potassium: 4.6 mmol/L (ref 3.5–5.1)
Sodium: 139 mmol/L (ref 135–145)
Total Bilirubin: 1 mg/dL (ref 0.3–1.2)
Total Protein: 6.8 g/dL (ref 6.5–8.1)

## 2018-12-20 LAB — POC SARS CORONAVIRUS 2 AG: SARS Coronavirus 2 Ag: POSITIVE — AB

## 2018-12-20 NOTE — ED Provider Notes (Signed)
[facility] Emergency Department Provider Note  ____________________________________________  Time seen: Approximately 7:41 PM  I have reviewed the triage vital signs and the nursing notes.   HISTORY  Chief Complaint Covid testing  Level 5 caveat patient noncontributory to history  HPI Brandi Nelson is a 85 y.o. female the emergency department with 4 possible boarding.  Patient lives at home but has home health.  Over the past 2 weeks she has had exposure to the home health personnel who tested positive for Covid.  Patient has no symptoms at this time but was informed by home health that the patient and to have 2 - Covid swabs for them to return.  No fevers, chills, nasal congestion, cough, shortness of breath is reported.  Patient is noncontributory to history.   Per the son, patient also had hospice however hospice has stated that they will not come out until patient has 24/7 in-home care.  Social work was involved and Education officer, museum stated to bring the patient to the emergency department for placement in a long-term care facility.  Patient son states that at his age, his health condition he is unable to care for the patient.  Patient is blind but no other reported long-term issues are reported by the son        Past Medical History:  Diagnosis Date  . Asthma   . Cancer ([place])    breast  . Dementia ([place])   . Hearing loss   . Macular degeneration     Patient Active Problem List   Diagnosis Date Noted  . Pancreatitis 10/17/2017  . Gallstone pancreatitis   . Palliative care by specialist   . DNR (do not resuscitate)   . Weakness generalized   . Cholecystitis with cholelithiasis 10/01/2017  . Dementia ([place]) 10/01/2017  . Dehydration 11/25/2016  . Rhabdomyolysis 11/25/2016  . Respiratory failure ([place]) 11/29/2015  . COPD exacerbation ([place]) 11/29/2015  . Chest pain 08/30/2014  . Elevated troponin 08/30/2014    Past Surgical History:  Procedure  Laterality Date  . MASTECTOMY      Prior to Admission medications   Not on File    Allergies Penicillin g  Family History  Problem Relation Age of Onset  . Heart attack Mother   . Heart attack Father     Social History Social History   Tobacco Use  . Smoking status: Never Smoker  . Smokeless tobacco: Never Used  Substance Use Topics  . Alcohol use: No  . Drug use: Not on file     Review of Systems  Constitutional: No reported fever/chills Eyes: Patient is blind ENT: No upper respiratory complaints. Cardiovascular: no reported chest pain. Respiratory: no reported cough. No reported SOB. Gastrointestinal:  No reported nausea, no reported vomiting.  No reported diarrhea.  No reported constipation. Genitourinary: Negative for dysuria. No hematuria Musculoskeletal: Negative for musculoskeletal pain. Skin: Negative for rash, abrasions, lacerations, ecchymosis. Neurological: Negative for headaches, focal weakness or numbness. 10-point ROS otherwise negative.  ____________________________________________   PHYSICAL EXAM:  VITAL SIGNS: ED Triage Vitals [12/20/18 1811]  Enc Vitals Group     BP      Pulse      Resp      Temp      Temp src      SpO2      Weight      Height      Head Circumference      [place] Flow      Pain Score  0     Pain Loc      Pain Edu?      Excl. in [place]?      Constitutional: Alert and but slightly confused.  Easily redirectable. Well appearing and in no acute distress. Eyes: Conjunctivae are normal. PERRL.  Head: Atraumatic. ENT:      Ears:       Nose: No congestion/rhinnorhea.      Mouth/Throat: Mucous membranes are moist.  Neck: No stridor.    Cardiovascular: Normal rate, regular rhythm. Normal S1 and S2.  Good peripheral circulation. Respiratory: Normal respiratory effort without tachypnea or retractions. Lungs CTAB. Good air entry to the bases with no decreased or absent breath sounds. Gastrointestinal: Bowel sounds 4  quadrants. Soft and nontender to palpation. No guarding or rigidity. No palpable masses. No distention. No CVA tenderness. Musculoskeletal: Full range of motion to all extremities. No gross deformities appreciated. Neurologic:  Normal speech and language. No gross focal neurologic deficits are appreciated.  Skin:  Skin is warm, dry and intact. No rash noted. Psychiatric: Mood and affect are normal. Speech and behavior are normal. Patient exhibits appropriate insight and judgement.   ____________________________________________   LABS (all labs ordered are listed, but only abnormal results are displayed)  Labs Reviewed  SARS CORONAVIRUS 2 (TAT 6-24 HRS)  SARS CORONAVIRUS 2 (TAT 6-24 HRS)  COMPREHENSIVE METABOLIC PANEL  CBC WITH DIFFERENTIAL/PLATELET  POC SARS CORONAVIRUS 2 AG -  ED   ____________________________________________  EKG   ____________________________________________  RADIOLOGY   No results found.  ____________________________________________    PROCEDURES  Procedure(s) performed:    Procedures    Medications - No data to display   ____________________________________________   INITIAL IMPRESSION / ASSESSMENT AND PLAN / ED COURSE  Pertinent labs & imaging results that were available during my care of the patient were reviewed by me and considered in my medical decision making (see chart for details).  Review of the [place] CSRS was performed in accordance of the [place] prior to dispensing any controlled drugs.           Patient's diagnosis is consistent with encounter for Covid swab.  Has been brought to the emergency department by the son who states that patient was exposed to Covid from in-home health care.  In-home health care refused to return to the patient had negative Covid test.  Patient also has hospice anticipating care and they refused to return until Patient is medically cleared.  Patient is currently asymptomatic.  Social work and Adult  [employer] also became involved and requested that patient be brought to the emergency department for placement.  At this time patient will be boarded..  Patient was seen in triage by myself, will be seen and assessed by additional provider once patient has a bed in the emergency department.     ____________________________________________  FINAL CLINICAL IMPRESSION(S) / ED DIAGNOSES  Final diagnoses:  Weak      NEW MEDICATIONS STARTED DURING THIS VISIT:  ED Discharge Orders    None          This chart was dictated using voice recognition software/Dragon. Despite best efforts to proofread, errors can occur which can change the meaning. Any change was purely unintentional.    Darletta Moll, PA-C 12/20/18 2028    Nena Polio, MD 12/21/18 [phone]

## 2018-12-20 NOTE — ED Notes (Signed)
Pt will not wear a mask. Pt is in hallway for COVID testing. Multiple attempts made to place mask on pt who will not comply.

## 2018-12-20 NOTE — ED Triage Notes (Signed)
Pt to the er for a covid test. Pt stays alone and has caregivers to come to the house. Multiple staff members have tested positive. Pt is unable to stay home alone and son says he cant stay with her. Pt is not symptomatic. Home health states they will not come back. Pt is here to stay in the ER until someone can figure something out.

## 2018-12-20 NOTE — ED Provider Notes (Signed)
[facility] Emergency Department Provider Note   ____________________________________________   First MD Initiated Contact with Patient 12/20/18 1844     (approximate)  I have reviewed the triage vital signs and the nursing notes.   HISTORY  Chief Complaint Covid testing    HPI Brandi Nelson is a 85 y.o. female who is blind who lives at home she has multiple people helping her.  3 of these have tested positive for Covid.  Therefore the organization that has those people has pulled out because they do not have anybody else.  The other 2 organizations at health care for her have pulled out because she has been exposed to Covid.  She has no one to take care of her at home.  Adult Protective Services has told her son who is in his 55s and cannot take care of her that she needs to come to the ER to get placed.  She is now here.  She has past medical history as noted below but is in good shape currently.  We will consult social work and see if we can get her some better help and may be get her back home.         Past Medical History:  Diagnosis Date  . Asthma   . Cancer ([place])    breast  . Dementia ([place])   . Hearing loss   . Macular degeneration     Patient Active Problem List   Diagnosis Date Noted  . Pancreatitis 10/17/2017  . Gallstone pancreatitis   . Palliative care by specialist   . DNR (do not resuscitate)   . Weakness generalized   . Cholecystitis with cholelithiasis 10/01/2017  . Dementia ([place]) 10/01/2017  . Dehydration 11/25/2016  . Rhabdomyolysis 11/25/2016  . Respiratory failure ([place]) 11/29/2015  . COPD exacerbation ([place]) 11/29/2015  . Chest pain 08/30/2014  . Elevated troponin 08/30/2014    Past Surgical History:  Procedure Laterality Date  . MASTECTOMY      Prior to Admission medications   Not on File    Allergies Penicillin g  Family History  Problem Relation Age of Onset  . Heart attack Mother   . Heart attack  Father     Social History Social History   Tobacco Use  . Smoking status: Never Smoker  . Smokeless tobacco: Never Used  Substance Use Topics  . Alcohol use: No  . Drug use: Not on file    Review of Systems  Constitutional: No fever/chills Eyes: No visual changes. ENT: No sore throat. Cardiovascular: Denies chest pain. Respiratory: Denies shortness of breath. Gastrointestinal: No abdominal pain.  No nausea, no vomiting.  No diarrhea.  No constipation. Genitourinary: Negative for dysuria. Musculoskeletal: Negative for back pain. Skin: Negative for rash. Neurological: Negative for headaches, focal weakness  ____________________________________________   PHYSICAL EXAM:  VITAL SIGNS: ED Triage Vitals [12/20/18 1811]  Enc Vitals Group     BP      Pulse      Resp      Temp      Temp src      SpO2      Weight      Height      Head Circumference      [place] Flow      Pain Score 0     Pain Loc      Pain Edu?      Excl. in [place]?     Constitutional: Alert and oriented. Well appearing  and in no acute distress. Eyes: Conjunctivae are normal.  Head: Atraumatic. Nose: No congestion/rhinnorhea. Mouth/Throat: Mucous membranes are moist.  Oropharynx non-erythematous. Neck: No stridor.   Cardiovascular: Normal rate, regular rhythm. Grossly normal heart sounds.  Good peripheral circulation. Respiratory: Normal respiratory effort.  No retractions. Lungs CTAB. Gastrointestinal: Soft and nontender. No distention. No abdominal bruits. . Musculoskeletal: No lower extremity tenderness nor edema.  . Neurologic:  Normal speech and language. No gross focal neurologic deficits are appreciated.  Skin:  Skin is warm, dry and intact. No rash noted.   ____________________________________________   LABS (all labs ordered are listed, but only abnormal results are displayed)  Labs Reviewed  SARS CORONAVIRUS 2 (TAT 6-24 HRS)  COMPREHENSIVE METABOLIC PANEL  CBC WITH  DIFFERENTIAL/PLATELET  POC SARS CORONAVIRUS 2 AG -  ED   ____________________________________________  EKG   ____________________________________________  RADIOLOGY  ED MD interpretatio  Official radiology report(s): No results found.  ____________________________________________   PROCEDURES  Procedure(s) performed (including Critical Care):  Procedures   ____________________________________________   INITIAL IMPRESSION / ASSESSMENT AND PLAN / ED COURSE  We will get the patient tested for Covid and evaluated and have social work check on her and get her some people that will help her at home.             ____________________________________________   FINAL CLINICAL IMPRESSION(S) / ED DIAGNOSES  Final diagnoses:  Weak  Actual diagnosis is unable to be cared for at home   ED Discharge Orders    None       Note:  This document was prepared using Dragon voice recognition software and may include unintentional dictation errors.    Nena Polio, MD 12/20/18 2012

## 2018-12-21 ENCOUNTER — Emergency Department

## 2018-12-21 DIAGNOSIS — R05 Cough: Secondary | ICD-10-CM | POA: Diagnosis not present

## 2018-12-21 DIAGNOSIS — U071 COVID-19: Secondary | ICD-10-CM | POA: Diagnosis not present

## 2018-12-21 DIAGNOSIS — R531 Weakness: Secondary | ICD-10-CM | POA: Diagnosis present

## 2018-12-21 DIAGNOSIS — J449 Chronic obstructive pulmonary disease, unspecified: Secondary | ICD-10-CM | POA: Diagnosis not present

## 2018-12-21 DIAGNOSIS — F0390 Unspecified dementia without behavioral disturbance: Secondary | ICD-10-CM | POA: Diagnosis not present

## 2018-12-21 DIAGNOSIS — J45909 Unspecified asthma, uncomplicated: Secondary | ICD-10-CM | POA: Diagnosis not present

## 2018-12-21 LAB — SARS CORONAVIRUS 2 (TAT 6-24 HRS): SARS Coronavirus 2: POSITIVE — AB

## 2018-12-21 NOTE — ED Notes (Signed)
Pt woke while placing BP cuff and refused to allow BP cuff and SPO2 sensor.

## 2018-12-21 NOTE — ED Notes (Signed)
Pt sitting in bed in NAD, sitter at bedside

## 2018-12-21 NOTE — Progress Notes (Signed)
Received TOC consult for placement. Patient from home with 24 hours care being received in the home, but caregivers recently found out they are COVID+. Patient brought here for COVID testing. Patient unable to see due to macular degeneration; very hard of hearing, COVID+ and Dementia per, RN Dajea.   Son also stated patient lives alone and is fairly independent with ADLs most because she is familiar with the outlay of her home. Patient can ambulate on her own with supervision, but per son does not use any assistive equipment. Patient can feed herself if food put directly in front of her. Patient was being cared for overnight by agency (unknown) and in the daytime by nieces and this has been the arrangement for the last 2 years, per son. Caregivers cannot return until patient has 2 negative COVID tests 24 hours apart.   Spoke with patient's son Brandi Nelson [phone], who stated patient was supposed to be transitioning on Wednesday or Thursday of this week to [facility] (SNF), but was informed that they would not have a bed for her until possibly next week. Son planning to pay privately to supplement mom's cost of care at facility. Provided [facility] ED contact number for patient to gets updated about patient. Will continue to follow.

## 2018-12-21 NOTE — ED Notes (Signed)
Pt given soft diet dinner tray

## 2018-12-21 NOTE — ED Notes (Signed)
This tech walking by pts room to see pt attempting to get out of bed. Pt had both legs hanging through side rails. This tech assisted pt to the toilet. Pt able to urinate. Brief was wet so brief was changed.

## 2018-12-21 NOTE — ED Notes (Signed)
I spoke with the patient's son over the phone to ask about home medications. According to him, the patient takes no regular medications and is only given a melatonin at bathtimes to help calm her.   Historically, patient was prescribed LORAZEPAM 0.5mg  BID for anxiety, but there is no record of the medication being dispensed since September 2020.   ** The above is intended solely for informational and/or communicative purposes. It should in no way be considered an endorsement of any specific treatment, therapy or action. **

## 2018-12-21 NOTE — ED Notes (Signed)
Unable to obtain pt blood pressure d/t patient repeatedly pulling bp cuff off her arm. Pt is alert at her baseline at this time with no complaints of pain.

## 2018-12-21 NOTE — ED Notes (Signed)
Spoke with pt's daughter Ettamae Shepperd who is not on pt's demographics, did not give her any info on patient but did advise her to speak with pt's son who is on patient's chart

## 2018-12-21 NOTE — ED Notes (Signed)
Social worker Arts development officer just contacted this RN to get info on patient and stated that she would be getting in contact with the pt son to discuss placement.

## 2018-12-21 NOTE — NC FL2 (Signed)
  [place] LEVEL OF CARE SCREENING TOOL     IDENTIFICATION  Patient Name: Brandi Nelson Birthdate: [birth year] Sex: female Admission Date (Current Location): 12/20/2018  [place] and [place] Number:  Engineering geologist and Address:  [facility], [address], [place], [place] [address]      Provider Number: [id number]  Attending Physician Name and Address:  No att. providers found  Relative Name and Phone Number:  Aleise Toolan [phone] / [phone]    Current Level of Care: SNF Recommended Level of Care: [place] Prior Approval Number:    Date Approved/Denied:   PASRR Number: [id number]  Discharge Plan: SNF    Current Diagnoses: Patient Active Problem List   Diagnosis Date Noted  . Pancreatitis 10/17/2017  . Gallstone pancreatitis   . Palliative care by specialist   . DNR (do not resuscitate)   . Weakness generalized   . Cholecystitis with cholelithiasis 10/01/2017  . Dementia ([place]) 10/01/2017  . Dehydration 11/25/2016  . Rhabdomyolysis 11/25/2016  . Respiratory failure ([place]) 11/29/2015  . COPD exacerbation ([place]) 11/29/2015  . Chest pain 08/30/2014  . Elevated troponin 08/30/2014    Orientation RESPIRATION BLADDER Height & Weight     Self  Normal Incontinent Weight:   Height:     BEHAVIORAL SYMPTOMS/MOOD NEUROLOGICAL BOWEL NUTRITION STATUS      Incontinent Diet(Normal)  AMBULATORY STATUS COMMUNICATION OF NEEDS Skin   Limited Assist Verbally Normal                       Personal Care Assistance Level of Assistance  Bathing, Feeding, Dressing Bathing Assistance: Maximum assistance Feeding assistance: Limited assistance Dressing Assistance: Maximum assistance     Functional Limitations Info  Sight, Hearing, Speech Sight Info: Impaired(Blind; Macular Degeneration) Hearing Info: Impaired(Hard of Hearing) Speech Info: Adequate    SPECIAL CARE FACTORS FREQUENCY                       Contractures Contractures Info: Not present    Additional Factors Info  Code Status, Allergies, Isolation Precautions Code Status Info: DNR Allergies Info: Penicillin G     Isolation Precautions Info: Air/Contact Precautions     Current Medications (12/21/2018):  This is the current hospital active medication list No current facility-administered medications for this encounter.   Current Outpatient Medications  Medication Sig Dispense Refill  . Melatonin 3 MG TABS Take 3 mg by mouth See admin instructions. Give 1 tablet (3mg ) by mouth at bathtime for sedation       Discharge Medications: Please see discharge summary for a list of discharge medications.  Relevant Imaging Results:  Relevant Lab Results:   Additional Information SSN: [id number]  Truitt Merle, LCSW

## 2018-12-22 DIAGNOSIS — U071 COVID-19: Secondary | ICD-10-CM | POA: Diagnosis not present

## 2018-12-22 NOTE — ED Notes (Signed)
This tech obtained oral care tray. Provided pt with mouth care. Pt had top denture in place and was unable to remove it. Rinsed and brushed with sponge on outside of denture.

## 2018-12-22 NOTE — ED Notes (Signed)
Pt given busy body mat at this time. Pt resting in recliner at this time. NAD noted. Will continue to monitor. 1:1 sitter remains at bedside.

## 2018-12-22 NOTE — ED Notes (Signed)
This RN to bedside, Dorian, EDT at bedside, pt resting in bed with respirations even and unlabored, skin warm, dry, and intact. This RN brought recliner to bedside to encourage patient movement while in ED. While attempt to move patient when she awakens. Will continue to monitor for further patient needs.

## 2018-12-22 NOTE — ED Notes (Signed)
NAD noted at time of this time. Pt sitting in recliner with eyes closed, Pt noted to be fiddling with busy mat at this time. Pt continues to have 1:1 sitter at bedside at this time.

## 2018-12-22 NOTE — ED Notes (Signed)
This RN to bedside, pt moved to low bed for safety. Pt remains with 1:1 safety sitter at this time. Pt noted to be HOH at this time. Pt visualized in NAD at this time. Will continue to monitor for further patient needs.

## 2018-12-22 NOTE — ED Notes (Signed)
Pts clothes changed into blue hospital scrubs. Pt given bed bath by this tech.

## 2018-12-22 NOTE — ED Notes (Addendum)
This tech spoke with pts daughter Blanch Media). Code was provided. Pts daughter stated pt likes to wear pink visor bc the lights hurt the pts eyes. Pink visor and blanket are comforting to the pt. Pt does the best in the mornings and does not like showers. At home pts home healthcare aide would come every morning at 6:30 AM and give the pt a bedbath and get pt dressed for the day. Pt is very independent but fearful of using too much toilet paper and should have assistance when wiping after voiding. Pt previously was not using any assistive ambulation devices and leg weakness is what led to her being brought in. Pts daughter gave phone number: [phone].

## 2018-12-22 NOTE — ED Notes (Signed)
Meal tray placed at bedside at this time, pt continues resting in bed with lights dimmed, 1:1 sitter given meal tray.

## 2018-12-22 NOTE — ED Notes (Signed)
This RN spoke with son, security password established with patient's son [name]. Explained 1:1 sitter, explained that patient was asleep at this time.

## 2018-12-22 NOTE — ED Notes (Signed)
PT at bedside at this time 

## 2018-12-22 NOTE — ED Notes (Signed)
Pt transferred from low bed to recliner chair by this RN and Dorian, EDT. Pt tolerated well. Pt noted to be alert, and stating she is okay with eating lunch at this time. Pt being assisted by Dorian, EDT. Pt given bed bath and changed into clean paper scrubs by Dorian, EDT.

## 2018-12-22 NOTE — ED Notes (Signed)
Pt assisted by Caitlyn, EDT and Lexi, RN to use bathroom. Pts brief was dry and pt was able to void in toilet.

## 2018-12-22 NOTE — Evaluation (Signed)
Physical Therapy Evaluation Patient Details Name: Brandi Nelson MRN: [id number] DOB: Oct 30, 1907 Today's Date: 12/22/2018   History of Present Illness  Brandi Nelson is a 89yoF who comes to [facility] 12/20/18 after several days weakness. Pt tested positive for COVID19 after finding out that her in-home caregivers has test postive. Per chart review, DT Reports Pt previously was not using any assistive ambulation devices and leg weakness is what led to her being brought in. In home caregivers were assisting the pt with bathing and dressing. Pt is very HOH and visually impaired. Pt was here in Sept 2020 and able to AMB household distances with close assist durign PT evaluation.  Clinical Impression  Pt admitted with above diagnosis. Pt currently with functional limitations due to the deficits listed below (see "PT Problem List"). Upon entry, pt in recliner, awake, interactive. Pt has sitter at bedside. Pt HOH hence communication is difficult as she rarely can comprehend the spoken words from author. Pt following multimodal cues to attempt coming to standing multiple times, eventually does better with RW, but balance is still limited with several LOB resulting to collpse to sitting. Pt is easily distracted and not following commands to maintain grip on RW, as she is more concerned about her pants being pulled up until she begins to sway posterior. Pt not able to be redirected for AMB in session due to deafness, but did AMB to BR with staff earlier with HHA and min-modA. Pt is typically able to AMB with hand held assist, but today this is very limited but acute weakness and balance issues. Functional mobility assessment demonstrates increased effort/time requirements, poor tolerance, and need for physical assistance, whereas the patient performed these at a higher level of independence PTA. 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 Pt lives alone and has no assistance available. A STR stay would offer the best chance to recover to baseline for eventual return to home.  Pt will benefit from skilled PT intervention to increase independence and safety with basic mobility in preparation for discharge to the venue listed below.       Follow Up Recommendations SNF;Supervision for mobility/OOB;Supervision/Assistance - 24 hour    Equipment Recommendations  (youth rolling walker)    Recommendations for Other Services       Precautions / Restrictions Precautions Precautions: Fall Restrictions Weight Bearing Restrictions: No      Mobility  Bed Mobility               General bed mobility comments: pt up to chair, required +2 with nursing to get her to chair earlier this date.  Transfers Overall transfer level: Needs assistance Equipment used: Rolling walker (2 wheeled) Transfers: Sit to/from Stand Sit to Stand: Min assist         General transfer comment: rises multiple times as cued, needs constant cuing for safety, as she is easily distracted. Multiple LOB, pt easily anxious and attempting impulsively to return to sitting  Ambulation/Gait Ambulation/Gait assistance: (deferred at this time but did AMB with HHA to toilet with NA earlier this date.)              Stairs            Wheelchair Mobility    Modified Rankin (Stroke Patients Only)       Balance Overall balance assessment: Needs assistance Sitting-balance support: Single extremity supported;Feet supported Sitting balance-Leahy  Scale: Fair     Standing balance support: Bilateral upper extremity supported;During functional activity Standing balance-Leahy Scale: Poor                               Pertinent Vitals/Pain Pain Assessment: No/denies pain    Home Living Family/patient expects to be discharged to:: Private residence Living Arrangements: Alone Available Help at  Discharge: (per Sept 2019 eval, pt alone at home for most of day, 2 sons assisting with meals during day. Pt recently lost her caregivers at home and son not able to assist her physically.) Type of Home: House Home Access: Level entry     Home Layout: One level Home Equipment: Walker - 2 wheels Additional Comments: Pt historically refuses DME when offered, declines to use    Prior Function Level of Independence: Needs assistance   Gait / Transfers Assistance Needed: up until recenty AMB in home with hand held assist, pt needed assist for ADL and meals.           Hand Dominance   Dominant Hand: Right    Extremity/Trunk Assessment   Upper Extremity Assessment Upper Extremity Assessment: Generalized weakness    Lower Extremity Assessment Lower Extremity Assessment: Generalized weakness    Cervical / Trunk Assessment Cervical / Trunk Assessment: Kyphotic  Communication   Communication: HOH  Cognition Arousal/Alertness: Awake/alert Behavior During Therapy: WFL for tasks assessed/performed Overall Cognitive Status: No family/caregiver present to determine baseline cognitive functioning                                 General Comments: perseverting on finding her shoes; difficulty following cues, contantly reoriented to activity.      General Comments      Exercises     Assessment/Plan    PT Assessment Patient needs continued PT services  PT Problem List Decreased strength;Decreased range of motion;Decreased activity tolerance;Decreased balance;Decreased mobility;Decreased cognition;Decreased knowledge of precautions;Decreased safety awareness       PT Treatment Interventions DME instruction;Balance training;Gait training;Stair training;Functional mobility training;Therapeutic activities;Therapeutic exercise;Patient/family education;Cognitive remediation    PT Goals (Current goals can be found in the Care Plan section)  Acute Rehab PT Goals PT Goal  Formulation: Patient unable to participate in goal setting    Frequency Min 2X/week   Barriers to discharge Decreased caregiver support;Inaccessible home environment      Co-evaluation               AM-PAC PT "6 Clicks" Mobility  Outcome Measure Help needed turning from your back to your side while in a flat bed without using bedrails?: A Lot Help needed moving from lying on your back to sitting on the side of a flat bed without using bedrails?: A Lot Help needed moving to and from a bed to a chair (including a wheelchair)?: A Lot Help needed standing up from a chair using your arms (e.g., wheelchair or bedside chair)?: A Little Help needed to walk in hospital room?: A Lot Help needed climbing 3-5 steps with a railing? : Total 6 Click Score: 12    End of Session   Activity Tolerance: Patient tolerated treatment well Patient left: in chair;with nursing/sitter in room;with call bell/phone within reach Nurse Communication: Mobility status PT Visit Diagnosis: Unsteadiness on feet (R26.81);Difficulty in walking, not elsewhere classified (R26.2);Other abnormalities of gait and mobility (R26.89);Muscle weakness (generalized) (M62.81)    Time:  [id number] PT Time Calculation (min) (ACUTE ONLY): 17 min   Charges:   PT Evaluation $PT Eval Moderate Complexity: 1 Mod          6:12 PM, 12/22/18 Etta Grandchild, PT, DPT Physical Therapist - [facility]  [phone] ([place])    Rosalio Catterton C 12/22/2018, 6:05 PM

## 2018-12-22 NOTE — ED Notes (Signed)
EDP made aware unable to obtain BP due to patient not tolerating. States okay to not obtain BP at this time. Will continue to monitor.

## 2018-12-22 NOTE — ED Notes (Signed)
This RN to bedside, 1:1 sitter at bedside. Pt visualized in NAD at this time. Pt resting in bed. Pt noted to be singing while in bed. Per night shift EDT pt refusing BP at this time, refusing cardiac monitoring, refusing to wear mask. Handoff between sitters witnessed by this RN.

## 2018-12-22 NOTE — ED Notes (Addendum)
Attempted to take pts BP. Pt tolerated small cuff on wrist yesterday with this tech. Would not tolerate at this time. This tech is 1:1 sitting with pt.

## 2018-12-22 NOTE — ED Notes (Signed)
Pt was assisted by this EDT with eating dinner after pt stated she was ready to eat. Pt ate about 20% of food and said she was done.

## 2018-12-22 NOTE — ED Notes (Signed)
Report given to Dawn RN

## 2018-12-23 NOTE — ED Notes (Addendum)
Per APS, patient's son reportedly thinks patient has bed available at [facility]. Social worker states patient has been denied a bed due to age and existing diagnoses.

## 2018-12-23 NOTE — ED Notes (Signed)
Patient given meal tray. Safety sitter at bedside to assist with meal.

## 2018-12-23 NOTE — ED Notes (Signed)
Sitter at bedside, no complaints. Sitter was unable to obtain vital signs due to patient agitation. Will attempt again later

## 2018-12-23 NOTE — ED Notes (Addendum)
Helped pt to toilet. Pt ate half a banana.

## 2018-12-23 NOTE — Care Management (Addendum)
ED RN CM: Call to [facility] for clarification of discharge plan. Per Magda Paganini patient was not accepted to [facility] due to financial reasons as previously thought by family. [facility] is not accepting COVID patients at this time. Unable to return home until 2 negative tests due to private duty aide requirements. RN CM will outreach to son and update as well as get permission to send out bed requests to facilities that are accepting COVID (+) patients .  RN CM: Call to son, [name], to update on need for bed requests to be sent out. Son requested RN call back in 10 minutes as he was currently on the phone with "social worker".   RN CM: Discussed discharge plan with son, [name], who was agreeable with potential transfer to SNF if bed available. Explained in detail process for requesting bed and insurance approval.   RN CM: Incoming call from daughter, [phone] requesting update on SNF placement. CM updated that currently waiting on bed offers and difficulty with placement due to COVID (+). Will update as bed offers are received.Daughter is 2 hours away and concerned about timeline with transfer.   RN CM: Updated search to include [facility] who is accepting COVID patients. LVMM For Sabrina @ SNF that information had been sent over for review.

## 2018-12-23 NOTE — ED Notes (Signed)
This RN attempted to take vital signs again, patient adamantly refused. Will let patient sleep and attempt again during shift

## 2018-12-23 NOTE — ED Notes (Signed)
Helped pt to toilet and then put her in the bed at her request.

## 2018-12-23 NOTE — ED Notes (Signed)
Pt ate about 40% of dinner tray Pt ate 100% chocolate pudding  Pt drank 30%tea   Lm edt

## 2018-12-23 NOTE — ED Notes (Signed)
Pt moved from bed to recliner chair to prepare to eat dinner Pt ate whole banana and some sips of water] ] Lm edt

## 2018-12-23 NOTE — ED Notes (Signed)
Received report from Melody, EDT. This tech at bedside as 1:1 sitter. Pt currently sleeping at this time.

## 2018-12-23 NOTE — ED Notes (Signed)
Patient with sitter in room, no complaints. Currently resting in armchair. No meds due at this time

## 2018-12-24 DIAGNOSIS — M6281 Muscle weakness (generalized): Secondary | ICD-10-CM | POA: Diagnosis not present

## 2018-12-24 DIAGNOSIS — R279 Unspecified lack of coordination: Secondary | ICD-10-CM | POA: Diagnosis not present

## 2018-12-24 DIAGNOSIS — Z743 Need for continuous supervision: Secondary | ICD-10-CM | POA: Diagnosis not present

## 2018-12-24 DIAGNOSIS — J449 Chronic obstructive pulmonary disease, unspecified: Secondary | ICD-10-CM | POA: Diagnosis not present

## 2018-12-24 DIAGNOSIS — R2681 Unsteadiness on feet: Secondary | ICD-10-CM | POA: Diagnosis not present

## 2018-12-24 DIAGNOSIS — R1312 Dysphagia, oropharyngeal phase: Secondary | ICD-10-CM | POA: Diagnosis not present

## 2018-12-24 DIAGNOSIS — R5381 Other malaise: Secondary | ICD-10-CM | POA: Diagnosis not present

## 2018-12-24 DIAGNOSIS — R41841 Cognitive communication deficit: Secondary | ICD-10-CM | POA: Diagnosis not present

## 2018-12-24 DIAGNOSIS — U071 COVID-19: Secondary | ICD-10-CM | POA: Diagnosis not present

## 2018-12-24 NOTE — ED Notes (Addendum)
Pt sitting in chair. 1:1 sitter Brittney NT with pt. Pt alert, clam, and talkative. Pt playing with facial cloth. Pt refused to have BP checked.

## 2018-12-24 NOTE — ED Notes (Signed)
Helped pt. to bedside commode w/ Sherral Hammers, RN; returned pt to bed. Was able to get pts. vitals at this time. This tech remains at beside as 1:1 sitter at this time.

## 2018-12-24 NOTE — ED Notes (Signed)
Dietary called due to patient not receiving tray.

## 2018-12-24 NOTE — ED Notes (Signed)
Patient asleep, no complaints at present. Call bell within reach. NAD, vital signs stable. 

## 2018-12-24 NOTE — ED Notes (Signed)
Pt continues to sit in chair with lights on. NAD noted at this time. Pt continues to fidget with busy mat and wash cloths. 1:1 sitter remains with patient. Pt holding conversations with sitter. Will continue to monitor.

## 2018-12-24 NOTE — ED Notes (Signed)
Pt awake asking the time and returned to sleep. This tech remains at bedside as 1:1 Air cabin crew at this time.

## 2018-12-24 NOTE — ED Notes (Signed)
Pt up to recliner chair at this time. Pt tolerated well. Pt visor placed back on patient at this time due to light hurting her eyes. Lights turned on to encourage routine with patient. Pt provided water at her request. Pt remains with 1:1 sitter at bedside at this time. Pt provided with busy body mat for comfort/entertainment. Pt refuses TV at this time. Will continue to monitor for further patient needs.

## 2018-12-24 NOTE — ED Notes (Signed)
Pt. still sleeping. This tech remains at beside as 1:1 sitter at this time.

## 2018-12-24 NOTE — ED Notes (Signed)
EDP made aware that patient has bed assigned at [facility], states he will do dispo.

## 2018-12-24 NOTE — ED Notes (Signed)
Pt still sleeping; this tech remains as 1:1 sitter at this time.

## 2018-12-24 NOTE — ED Notes (Signed)
Patient sleeping, NAD

## 2018-12-24 NOTE — ED Notes (Signed)
Requested that current NT sitting with pt collect HR/O2 sat. Pt sleeping upon this RN's entrance to room. Pt remains in recliner.

## 2018-12-24 NOTE — ED Notes (Signed)
[facility] called for transport to [facility]

## 2018-12-24 NOTE — ED Notes (Signed)
Pt visualized in NAD at this time. Pt sitting in chair at this time fiddling with busy mat. Pt continues to have 1:1 sitter at this time. TV turned on at this time for patient entertainment.

## 2018-12-24 NOTE — ED Notes (Signed)
Brittney NT remains with pt. Brittney will collect vital signs soon. Pt continues to refuse BPs.

## 2018-12-24 NOTE — ED Notes (Signed)
Pt still sleeping; this tech remains at bedside as 1:1 sitter at this time.

## 2018-12-24 NOTE — ED Notes (Signed)
EDP aware that patient refusing to allow BP at this time. This RN and Urban Gibson, EDT attempted for BP without success.

## 2018-12-24 NOTE — ED Notes (Signed)
Pt awake asking time, then returned to sleep. This tech remains at bedside as 1:1 sitter at this time.

## 2018-12-24 NOTE — ED Notes (Signed)
Pt was assisted by this tech with eating brkfst tray. Pt ate about 25% of tray before stating she did not want any more.

## 2018-12-24 NOTE — ED Notes (Signed)
Pt. sleeping. This tech remain at beside as 1:1 sitter at this time.

## 2018-12-24 NOTE — ED Notes (Signed)
Pt resting in chair at this time. Pt interacting appropriately with Caitlyn, EDT, pt eating lunch at this time. Pt visualized in NAD, continues to await placement a this time. Will continue to monitor for further patient needs.

## 2018-12-24 NOTE — ED Notes (Signed)
This RN to bedside, pt visualized in NAD, pt resting in bed, pt Brandi Nelson be visualized talking to herself, this RN asked patient if she was ready to get into chair, pt asked, "is it time to get up?" this RN explained it was 7am, pt states, "oh well I should have been up an hour ago!" This RN explained to patient that this RN and Caitlyn, EDT will assist patient to the chair. Pt states understanding.

## 2018-12-24 NOTE — Care Management (Addendum)
RN CMLiana Gerold SNF bed @ [facility]. Call to [facility] @ SNF who states bed may not be available until tomorrow at the earliest. Traci requested call back later today to confirm bed availability. CM will continue to seek other placement sooner while waiting for [facility] place. Authorization request started with [employer] insurance.  RN CM: Call to daughter to update on offer from [facility] and update that insurance authorization is pending. Reviewed [employer] star ratings for [facility]. Provided contact info for [facility].   RN CM: Received authorization from [employer] Approval # [id number] and approved x7 days.   RN CM: Incoming call from [place] @ [facility] states pt has copay of $400 prior to admission to SNF. Notified son of copay and he is planning to call and complete payment.

## 2018-12-24 NOTE — ED Notes (Addendum)
Dijonnaise Mezquita (pt's son) updated at [phone]. Roger understands and agrees to pick up pt's wheelchair once PTAR arrives. Sitter remains with pt. Pt cooperative and calm.

## 2018-12-24 NOTE — ED Notes (Signed)
Pt will be transported via [facility] not [facility].

## 2018-12-24 NOTE — ED Notes (Signed)
Received report from [place], EDT. This tech will be sitting 1:1 at bedside. Pt. currently sleeping at this time.

## 2018-12-24 NOTE — ED Notes (Signed)
Attempted to call pt's son to notify of d/c. Son did not pick up.

## 2018-12-24 NOTE — ED Notes (Signed)
Sitter remains at bedside, patient comfortable in low bed no safety concerns at this time. NAD

## 2018-12-24 NOTE — ED Notes (Signed)
Pt still sleeping. Report given to [facility], EDT who will reman as 1:1 safety sitter at this time.

## 2018-12-24 NOTE — ED Notes (Signed)
Son Brandi Nelson' verbal consent to transport pt to [facility]. This RN and Jinny Blossom, RN witnesses to consent.

## 2019-01-10 DEATH — deceased

## 2019-03-02 IMAGING — CR DG CHEST 2V
2 series · 2 of 2 positions shown · non-contrast
Comparison: May 03, 2016

CLINICAL DATA: Unwitnessed fall last night.

EXAM:
CHEST  2 VIEW

[chest lat]
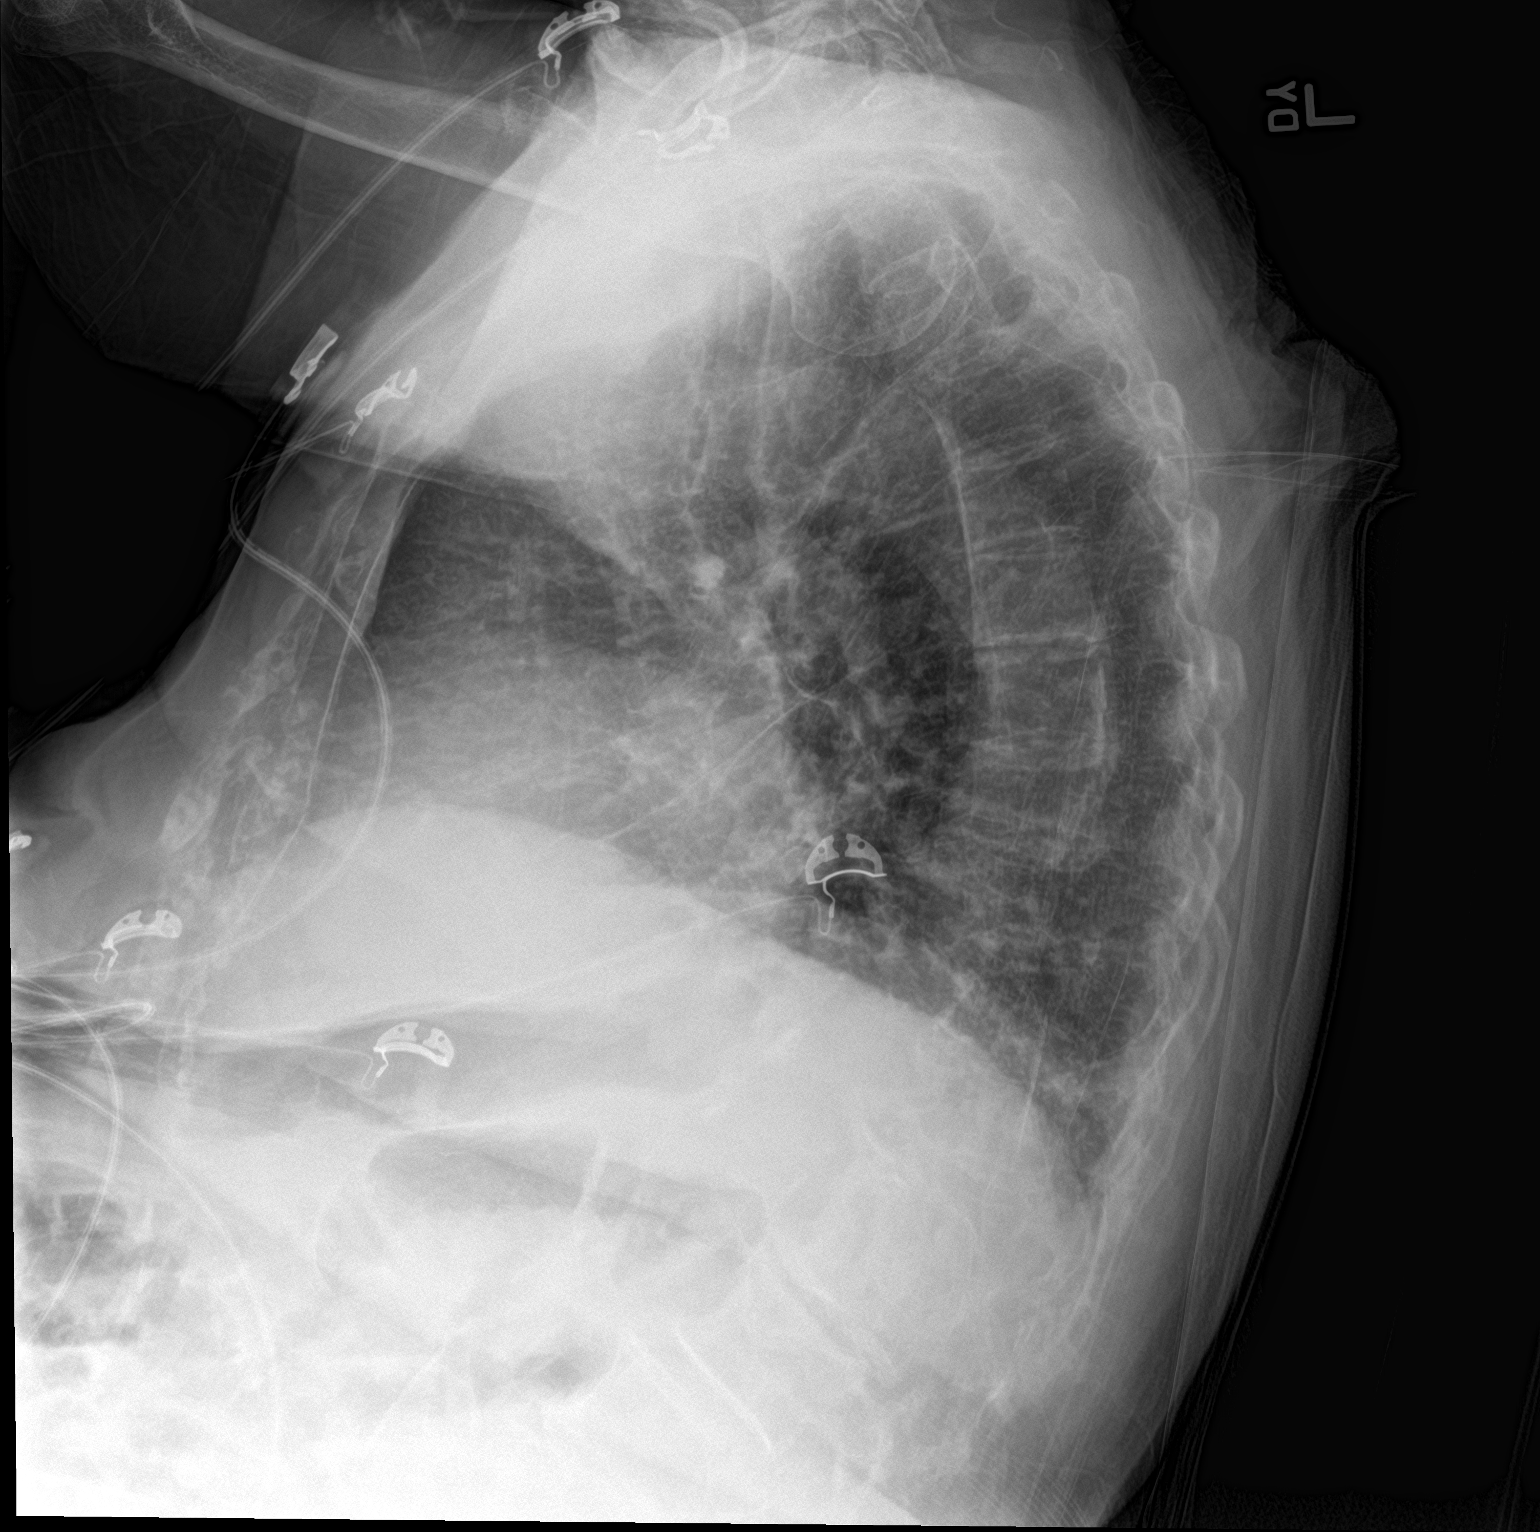

[chest ap]
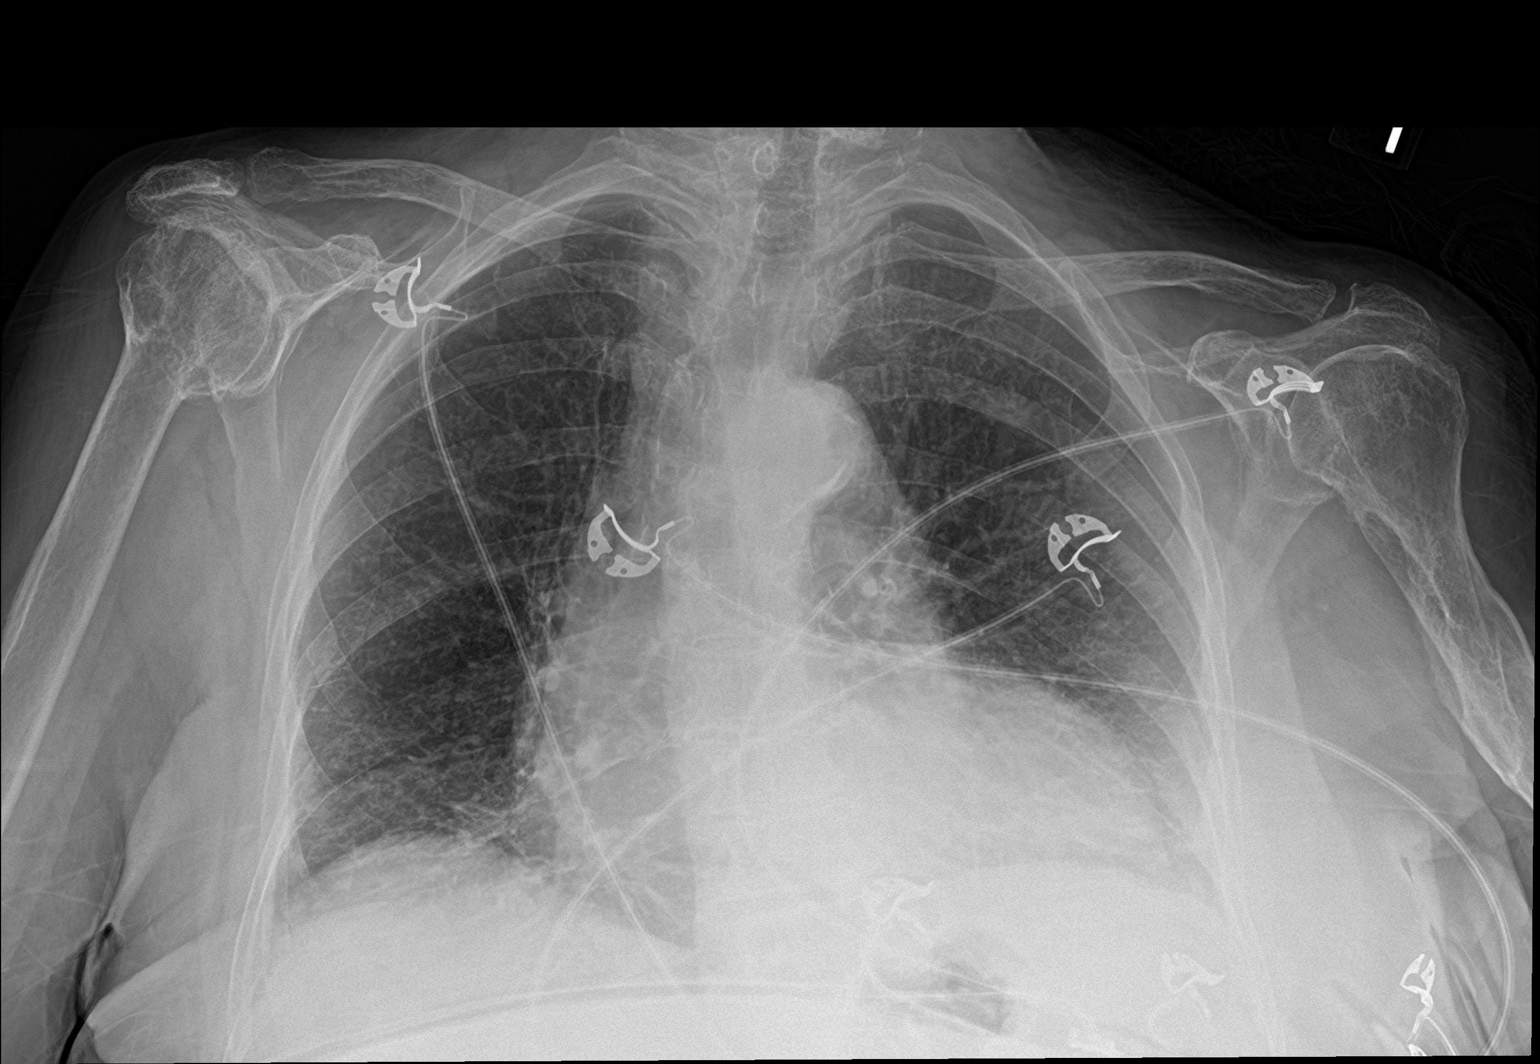

[2 of 2 positions shown; findings below may reference images not displayed]

FINDINGS: Chronic changes in the left humerus consistent with previous healed
fracture. Chronic changes in the proximal right humerus. Visualized
bones otherwise normal. No pneumothorax. Stable cardiomegaly and
torturous thoracic aorta. The hila and mediastinum are otherwise
normal. No pulmonary nodules, masses, or focal infiltrates. No
obvious changes identified in the thoracic spine on limited views.
IMPRESSION: No acute abnormality noted.

## 2019-03-02 IMAGING — CR DG PELVIS 1-2V
1 series · 1 of 1 positions shown · non-contrast
Comparison: None.

CLINICAL DATA: Unwitnessed fall.  Pain.

EXAM:
PELVIS - 1-2 VIEW

[pelvis ap]
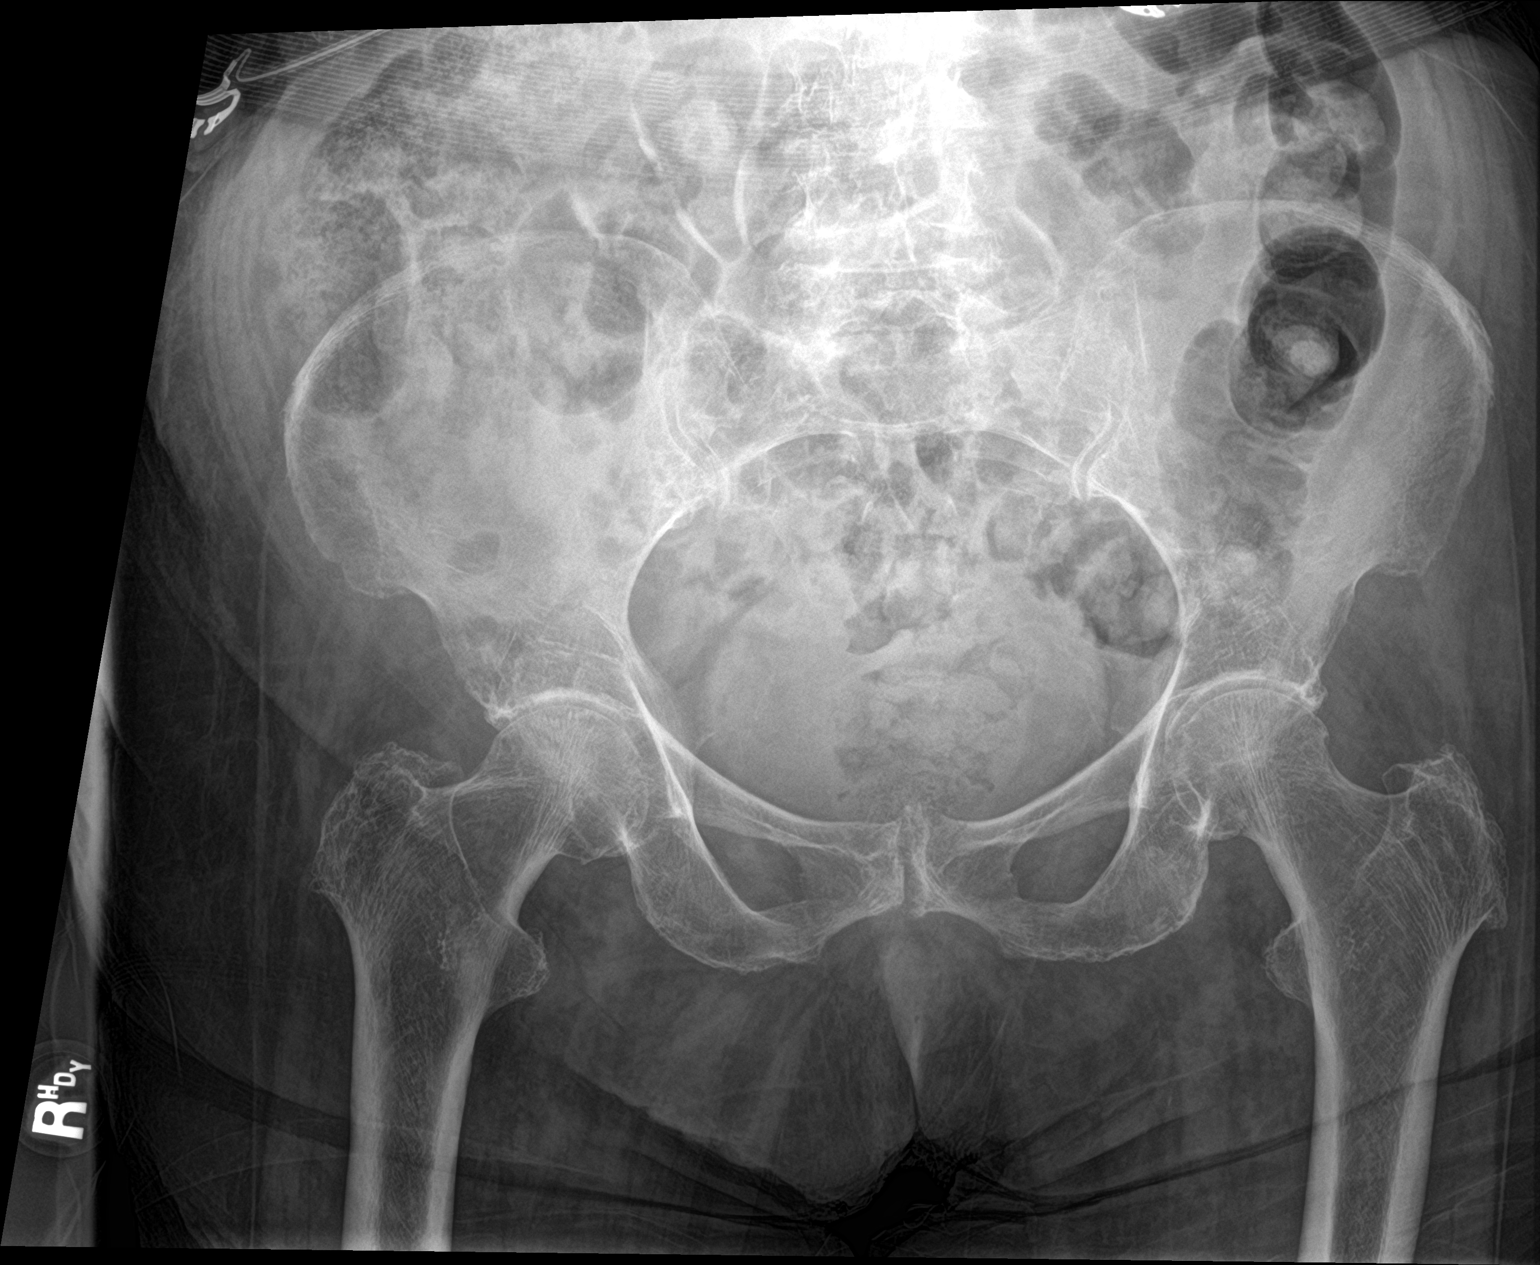

[1 of 1 positions shown; findings below may reference images not displayed]

FINDINGS: There is no evidence of pelvic fracture or diastasis. No pelvic bone
lesions are seen.
IMPRESSION: Negative.
